# Patient Record
Sex: Female | Born: 1995 | Race: White | Hispanic: No | Marital: Single | Smoking: Never smoker
Health system: Southern US, Community
[De-identification: ages and names within clinical notes are randomized; demographics above are authoritative.]

## PROBLEM LIST (undated history)

## (undated) DIAGNOSIS — F319 Bipolar disorder, unspecified: Secondary | ICD-10-CM

## (undated) DIAGNOSIS — J45909 Unspecified asthma, uncomplicated: Secondary | ICD-10-CM

## (undated) DIAGNOSIS — E063 Autoimmune thyroiditis: Secondary | ICD-10-CM

## (undated) HISTORY — PX: WISDOM TOOTH EXTRACTION: SHX21

---

## 2015-07-24 ENCOUNTER — Encounter (HOSPITAL_COMMUNITY): Payer: Self-pay | Admitting: Emergency Medicine

## 2015-07-24 ENCOUNTER — Emergency Department (HOSPITAL_COMMUNITY)
Admission: EM | Admit: 2015-07-24 | Discharge: 2015-07-26 | Disposition: A | Discharge status: Other Acute Inpatient Hospital | Payer: MEDICAID | Attending: Emergency Medicine | Admitting: Emergency Medicine

## 2015-07-24 DIAGNOSIS — F919 Conduct disorder, unspecified: Secondary | ICD-10-CM | POA: Diagnosis not present

## 2015-07-24 DIAGNOSIS — R45851 Suicidal ideations: Secondary | ICD-10-CM

## 2015-07-24 DIAGNOSIS — Z8639 Personal history of other endocrine, nutritional and metabolic disease: Secondary | ICD-10-CM | POA: Diagnosis not present

## 2015-07-24 DIAGNOSIS — Z046 Encounter for general psychiatric examination, requested by authority: Secondary | ICD-10-CM | POA: Diagnosis present

## 2015-07-24 DIAGNOSIS — F332 Major depressive disorder, recurrent severe without psychotic features: Secondary | ICD-10-CM | POA: Diagnosis present

## 2015-07-24 DIAGNOSIS — J45909 Unspecified asthma, uncomplicated: Secondary | ICD-10-CM | POA: Insufficient documentation

## 2015-07-24 DIAGNOSIS — F329 Major depressive disorder, single episode, unspecified: Secondary | ICD-10-CM | POA: Insufficient documentation

## 2015-07-24 DIAGNOSIS — Z3202 Encounter for pregnancy test, result negative: Secondary | ICD-10-CM | POA: Diagnosis not present

## 2015-07-24 HISTORY — DX: Unspecified asthma, uncomplicated: J45.909

## 2015-07-24 HISTORY — DX: Autoimmune thyroiditis: E06.3

## 2015-07-24 LAB — CBC
HCT: 39.7 % (ref 36.0–46.0)
Hemoglobin: 14 g/dL (ref 12.0–15.0)
MCH: 31.2 pg (ref 26.0–34.0)
MCHC: 35.3 g/dL (ref 30.0–36.0)
MCV: 88.4 fL (ref 78.0–100.0)
PLATELETS: 279 10*3/uL (ref 150–400)
RBC: 4.49 MIL/uL (ref 3.87–5.11)
RDW: 12.5 % (ref 11.5–15.5)
WBC: 8.1 10*3/uL (ref 4.0–10.5)

## 2015-07-24 LAB — COMPREHENSIVE METABOLIC PANEL
ALT: 16 U/L (ref 14–54)
AST: 19 U/L (ref 15–41)
Albumin: 4.6 g/dL (ref 3.5–5.0)
Alkaline Phosphatase: 89 U/L (ref 38–126)
Anion gap: 9 (ref 5–15)
BILIRUBIN TOTAL: 0.5 mg/dL (ref 0.3–1.2)
BUN: 16 mg/dL (ref 6–20)
CALCIUM: 9.5 mg/dL (ref 8.9–10.3)
CHLORIDE: 107 mmol/L (ref 101–111)
CO2: 23 mmol/L (ref 22–32)
Creatinine, Ser: 0.9 mg/dL (ref 0.44–1.00)
Glucose, Bld: 127 mg/dL — ABNORMAL HIGH (ref 65–99)
Potassium: 3.9 mmol/L (ref 3.5–5.1)
Sodium: 139 mmol/L (ref 135–145)
TOTAL PROTEIN: 7.3 g/dL (ref 6.5–8.1)

## 2015-07-24 LAB — RAPID URINE DRUG SCREEN, HOSP PERFORMED
AMPHETAMINES: NOT DETECTED
Barbiturates: NOT DETECTED
Benzodiazepines: NOT DETECTED
Cocaine: NOT DETECTED
OPIATES: NOT DETECTED
Tetrahydrocannabinol: NOT DETECTED

## 2015-07-24 LAB — POC URINE PREG, ED: PREG TEST UR: NEGATIVE

## 2015-07-24 LAB — ACETAMINOPHEN LEVEL: Acetaminophen (Tylenol), Serum: <10 ug/mL — ABNORMAL LOW (ref 10–30)

## 2015-07-24 LAB — SALICYLATE LEVEL

## 2015-07-24 LAB — ETHANOL

## 2015-07-24 NOTE — ED Provider Notes (Signed)
CSN: [id number]     Arrival date & time 07/24/15  2232 History   First MD Initiated Contact with Patient 07/24/15 2309     Chief Complaint  Patient presents with  . Suicidal  . Medical Clearance     (Consider location/radiation/quality/duration/timing/severity/associated sxs/prior Treatment) HPI Comments: Patient is a 19 year old female with a history of Hashimoto's thyroiditis and asthma. She presents to the emergency department today for psychiatric evaluation. She reports that she has been having worsening depression and suicidal ideations. She states that the holidays are difficult time for her because this is the first year that she does not have any family. She states that she became close with her biological mother, after leaving foster care, around Thanksgiving of last year, but her mother passed away a few months later. She states that she has never known her biological father because he committed suicide when she was a few months old. She reports cutting her L forearm with a razor tonight secondary to SI. She has a hx of suicide attempt at 17 by drinking a bottle of hairspray and then pulling a large TV down on top of her head. She denies HI, etoh use, or illicit drug use.   The history is provided by the patient. No language interpreter was used.    Past Medical History  Diagnosis Date  . Hashimoto's thyroiditis   . Asthma    History reviewed. No pertinent past surgical history. History reviewed. No pertinent family history. Social History  Substance Use Topics  . Smoking status: None  . Smokeless tobacco: None  . Alcohol Use: None   OB History    No data available      Review of Systems  Psychiatric/Behavioral: Positive for suicidal ideas and behavioral problems.  All other systems reviewed and are negative.   Allergies  Cogentin and Lamictal  Home Medications   Prior to Admission medications   Medication Sig Start Date End Date Taking? Authorizing Provider   phenylephrine (SUDAFED PE) 10 MG TABS tablet Take 20 mg by mouth every 6 (six) hours as needed (sinus congestion).   Yes Historical Provider, MD  pseudoephedrine-acetaminophen (TYLENOL SINUS) 30-500 MG TABS tablet Take 1 tablet by mouth every 4 (four) hours as needed (cold/flu symptoms).   Yes Historical Provider, MD  traZODone (DESYREL) 150 MG tablet Take 75-150 mg by mouth at bedtime as needed for sleep.  05/17/15  Yes Historical Provider, MD   BP 150/96 mmHg  Pulse 89  Temp(Src) 98.1 F (36.7 C) (Oral)  Resp 19  Ht  (1.575 m)  Wt 88.95 kg  BMI 35.86 kg/m2  SpO2 97%   Physical Exam  Constitutional: She is oriented to person, place, and time. She appears well-developed and well-nourished. No distress.  Nontoxic/nonseptic appearing  HENT:  Head: Normocephalic and atraumatic.  Right Ear: Tympanic membrane, external ear and ear canal normal. Tympanic membrane is not perforated, not erythematous, not retracted and not bulging.  Left Ear: Tympanic membrane, external ear and ear canal normal. Tympanic membrane is not perforated, not erythematous, not retracted and not bulging.  Mouth/Throat: Uvula is midline, oropharynx is clear and moist and mucous membranes are normal.  Eyes: Conjunctivae and EOM are normal. No scleral icterus.  Neck: Normal range of motion.  Pulmonary/Chest: Effort normal. No respiratory distress.  Musculoskeletal: Normal range of motion.  Neurological: She is alert and oriented to person, place, and time. She exhibits normal muscle tone. Coordination normal.  Skin: Skin is warm and dry. No rash  noted. She is not diaphoretic. No erythema. No pallor.  Psychiatric: Her speech is normal and behavior is normal. She is not actively hallucinating. She exhibits a depressed mood. She expresses suicidal ideation. She expresses no homicidal ideation. She expresses suicidal plans. She expresses no homicidal plans.  Nursing note and vitals reviewed.   ED Course  Procedures  (including critical care time) Labs Review Labs Reviewed  COMPREHENSIVE METABOLIC PANEL - Abnormal; Notable for the following:    Glucose, Bld 127 (*)    All other components within normal limits  ACETAMINOPHEN LEVEL - Abnormal; Notable for the following:    Acetaminophen (Tylenol), Serum <10 (*)    All other components within normal limits  ETHANOL  SALICYLATE LEVEL  CBC  URINE RAPID DRUG SCREEN, HOSP PERFORMED  POC URINE PREG, ED    Imaging Review No results found. I have personally reviewed and evaluated these images and lab results as part of my medical decision-making.   EKG Interpretation None      MDM   Final diagnoses:  Depression with suicidal ideation    19 year old female presents to the emergency department for depression with suicidal ideations. She has a history of suicide attempt at the age of 19. Labs reviewed and patient medically cleared. Patient meets criteria for inpatient psychiatric treatment. She is stable for transfer to behavioral health once a bed is available. Accepting physician, Dr. Jama Flavorsobos. Disposition to be set by oncoming ED provider.   Filed Vitals:   07/24/15 2236  BP: 150/96  Pulse: 89  Temp: 98.1 F (36.7 C)  TempSrc: Oral  Resp: 19  Height: 5\' 2"  (1.575 m)  Weight: 88.95 kg  SpO2: 97%     Antony MaduraKelly Thunder [place], PA-C 07/25/15 0542  Gilda Creasehristopher J Pollina, MD 07/25/15 [phone]

## 2015-07-24 NOTE — ED Notes (Addendum)
Pt called [employer] saying she had cut herself in a suicide attempt. On their arrival, [facility] found patient with a very minor superficial 0.5 in abrasion to left inner wrist. Pt here voluntarily with thoughts of suicide. States they were triggered by it being time for the holidays without her mother, who died earlier this year. Hx of father killing himself when she was younger. "I tell everyone I'm sad but no one really seems to understand or listen." Says she no longer has any plan for suicide, does not want to cut herself, just wants to stop the thoughts before they progress. Hx of a suicide attempt at 19 y.o. By "drinking a bottle of hairspray, then pulling a large T.V. down on top of my head."  Only complaining of bilateral ear pain from "problems I always have with my ears."

## 2015-07-25 DIAGNOSIS — F332 Major depressive disorder, recurrent severe without psychotic features: Secondary | ICD-10-CM | POA: Diagnosis not present

## 2015-07-25 DIAGNOSIS — R45851 Suicidal ideations: Secondary | ICD-10-CM

## 2015-07-25 DIAGNOSIS — F329 Major depressive disorder, single episode, unspecified: Secondary | ICD-10-CM | POA: Insufficient documentation

## 2015-07-25 LAB — TSH: TSH: 1.534 u[IU]/mL (ref 0.350–4.500)

## 2015-07-25 MED ORDER — CITALOPRAM HYDROBROMIDE 20 MG PO TABS
20.0000 mg | ORAL_TABLET | Freq: Every day | ORAL | Status: DC
  Administered 2015-07-25: 20 mg via ORAL
  Filled 2015-07-25: qty 1

## 2015-07-25 MED ORDER — DIPHENHYDRAMINE HCL 25 MG PO CAPS
50.0000 mg | ORAL_CAPSULE | Freq: Once | ORAL | Status: AC
  Administered 2015-07-25: 50 mg via ORAL
  Filled 2015-07-25: qty 2

## 2015-07-25 MED ORDER — TRAZODONE HCL 50 MG PO TABS
50.0000 mg | ORAL_TABLET | Freq: Every day | ORAL | Status: DC
  Administered 2015-07-25: 50 mg via ORAL
  Filled 2015-07-25: qty 1

## 2015-07-25 MED ORDER — HYDROXYZINE HCL 25 MG PO TABS: 25.0000 mg | ORAL_TABLET | Freq: Three times a day (TID) | ORAL | Status: DC | PRN

## 2015-07-25 NOTE — BH Assessment (Signed)
Spoke to Johnson ControlsJoann Glover, [facility], who said bed 402-1 is available and Pt has been accepted by Alberteen SamFran Hobson, NP to the service of Dr. Carmon GinsbergF. Jama Flavorsobos. Notified ED staff.  Harlin RainFord Ellis Patsy BaltimoreWarrick Jr, LPC, [facility], [facility] Triage Specialist [phone]
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
## 2015-07-25 NOTE — ED Notes (Signed)
Patient up eating breakfast.  States she is "depressed."  She denies any SI.  She showed me the cut on her left wrist.  [facility]

## 2015-07-25 NOTE — ED Notes (Signed)
Patient appears restless, anxious; cooperative. Denies SI, HI, AVH at present. Rates anxiety 5/10, feelings of depression 8/10. Patient scratching at arms, creating red areas, reports sensitive skin. States she has ongoing pressure in her ears, 2/10. Feels that she has been sleeping more than usual in the past few weeks. Also reports decreased appetite. States she is not currently receiving any tx for depression. Reports recent move and no therapy since October.  Encouragement offered. Oriented to the unit.  Q 15 safety checks in place.

## 2015-07-25 NOTE — BH Assessment (Signed)
Pending review for possible placement with [facility].  
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
## 2015-07-26 ENCOUNTER — Encounter (HOSPITAL_COMMUNITY): Payer: Self-pay | Admitting: *Deleted

## 2015-07-26 ENCOUNTER — Inpatient Hospital Stay (HOSPITAL_COMMUNITY)
Admission: AD | Admit: 2015-07-26 | Discharge: 2015-08-03 | DRG: 885 | Disposition: A | Discharge status: Home / Self Care | Payer: Medicaid Other | Source: Intra-hospital | Attending: Psychiatry | Admitting: Psychiatry

## 2015-07-26 DIAGNOSIS — E063 Autoimmune thyroiditis: Secondary | ICD-10-CM | POA: Diagnosis present

## 2015-07-26 DIAGNOSIS — R45851 Suicidal ideations: Secondary | ICD-10-CM | POA: Diagnosis present

## 2015-07-26 DIAGNOSIS — G4700 Insomnia, unspecified: Secondary | ICD-10-CM | POA: Diagnosis present

## 2015-07-26 DIAGNOSIS — F419 Anxiety disorder, unspecified: Secondary | ICD-10-CM | POA: Diagnosis present

## 2015-07-26 DIAGNOSIS — Z23 Encounter for immunization: Secondary | ICD-10-CM

## 2015-07-26 DIAGNOSIS — Z62810 Personal history of physical and sexual abuse in childhood: Secondary | ICD-10-CM | POA: Diagnosis present

## 2015-07-26 DIAGNOSIS — G4710 Hypersomnia, unspecified: Secondary | ICD-10-CM | POA: Diagnosis present

## 2015-07-26 DIAGNOSIS — F332 Major depressive disorder, recurrent severe without psychotic features: Secondary | ICD-10-CM | POA: Diagnosis present

## 2015-07-26 DIAGNOSIS — Z590 Homelessness: Secondary | ICD-10-CM

## 2015-07-26 DIAGNOSIS — F4310 Post-traumatic stress disorder, unspecified: Secondary | ICD-10-CM | POA: Diagnosis present

## 2015-07-26 DIAGNOSIS — Z825 Family history of asthma and other chronic lower respiratory diseases: Secondary | ICD-10-CM | POA: Diagnosis not present

## 2015-07-26 DIAGNOSIS — F410 Panic disorder [episodic paroxysmal anxiety] without agoraphobia: Secondary | ICD-10-CM | POA: Diagnosis present

## 2015-07-26 LAB — T3: T3 TOTAL: 124 ng/dL (ref 71–180)

## 2015-07-26 LAB — T4: T4 TOTAL: 6.3 ug/dL (ref 4.5–12.0)

## 2015-07-26 MED ORDER — CITALOPRAM HYDROBROMIDE 10 MG PO TABS
10.0000 mg | ORAL_TABLET | Freq: Every day | ORAL | Status: DC
  Administered 2015-07-26 – 2015-07-29 (×4): 10 mg via ORAL
  Filled 2015-07-26 (×6): qty 1

## 2015-07-26 MED ORDER — ACETAMINOPHEN 325 MG PO TABS
650.0000 mg | ORAL_TABLET | Freq: Four times a day (QID) | ORAL | Status: DC | PRN
  Administered 2015-08-02: 650 mg via ORAL
  Filled 2015-07-26: qty 2

## 2015-07-26 MED ORDER — MAGNESIUM HYDROXIDE 400 MG/5ML PO SUSP: 30.0000 mL | Freq: Every day | ORAL | Status: DC | PRN

## 2015-07-26 MED ORDER — ALUM & MAG HYDROXIDE-SIMETH 200-200-20 MG/5ML PO SUSP
30.0000 mL | ORAL | Status: DC | PRN
  Administered 2015-07-28 – 2015-08-02 (×2): 30 mL via ORAL
  Filled 2015-07-26 (×2): qty 30

## 2015-07-26 MED ORDER — TRAZODONE HCL 150 MG PO TABS: 75.0000 mg | ORAL_TABLET | Freq: Every evening | ORAL | Status: DC | PRN

## 2015-07-26 MED ORDER — PNEUMOCOCCAL VAC POLYVALENT 25 MCG/0.5ML IJ INJ
0.5000 mL | INJECTION | INTRAMUSCULAR | Status: AC
  Administered 2015-07-27: 0.5 mL via INTRAMUSCULAR

## 2015-07-26 MED ORDER — HYDROXYZINE HCL 25 MG PO TABS: 25.0000 mg | ORAL_TABLET | Freq: Three times a day (TID) | ORAL | Status: DC | PRN

## 2015-07-26 MED ORDER — DIPHENHYDRAMINE HCL 25 MG PO CAPS
50.0000 mg | ORAL_CAPSULE | Freq: Four times a day (QID) | ORAL | Status: DC | PRN
  Administered 2015-07-26 – 2015-07-27 (×2): 50 mg via ORAL
  Filled 2015-07-26 (×2): qty 2

## 2015-07-26 MED ORDER — INFLUENZA VAC SPLIT QUAD 0.5 ML IM SUSY
0.5000 mL | PREFILLED_SYRINGE | INTRAMUSCULAR | Status: AC
  Administered 2015-07-27: 0.5 mL via INTRAMUSCULAR
  Filled 2015-07-26: qty 0.5

## 2015-07-26 MED ORDER — TRAZODONE HCL 100 MG PO TABS
100.0000 mg | ORAL_TABLET | Freq: Every evening | ORAL | Status: DC | PRN
  Administered 2015-07-26 – 2015-07-27 (×2): 100 mg via ORAL
  Filled 2015-07-26 (×2): qty 1

## 2015-07-26 NOTE — Tx Team (Signed)
Initial Interdisciplinary Treatment Plan   PATIENT STRESSORS: Loss of Mother Marital or family conflict Traumatic event   PATIENT STRENGTHS: Ability for insight Average or above average intelligence Motivation for treatment/growth   PROBLEM LIST: Problem List/Patient Goals Date to be addressed Date deferred Reason deferred Estimated date of resolution  Suicidal Ideation 07/26/15     Depression 07/26/15     "develop coping skills"                                           DISCHARGE CRITERIA:  Improved stabilization in mood, thinking, and/or behavior Motivation to continue treatment in a less acute level of care Need for constant or close observation no longer present Verbal commitment to aftercare and medication compliance  PRELIMINARY DISCHARGE PLAN: Attend aftercare/continuing care group Outpatient therapy  PATIENT/FAMIILY INVOLVEMENT: This treatment plan has been presented to and reviewed with the patient, Carolyn Gibbs.  The patient and family have been given the opportunity to ask questions and make suggestions.  Juliann ParesBowman, Frederick Klinger Elizabeth 07/26/2015, 1:41 AM

## 2015-07-26 NOTE — Progress Notes (Signed)
Adult Psychoeducational Group Note  Date:  07/26/2015 Time:  8:51 PM  Group Topic/Focus:  Wrap-Up Group:   The focus of this group is to help patients review their daily goal of treatment and discuss progress on daily workbooks.  Participation Level:  Active  Participation Quality:  Appropriate and Attentive  Affect:  Appropriate  Cognitive:  Appropriate  Insight: Appropriate and Good  Engagement in Group:  Engaged  Modes of Intervention:  Education  Additional Comments:  Pt had a good day and pt was happy she made friends. Pt goal for tomorrow is to work on Pharmacologistcoping skills. This Clinical research associatewriter provided patient with a list of coping skills.   Merlinda FrederickKeshia S Colleen Donahoe 07/26/2015, 8:51 PM
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
## 2015-07-26 NOTE — [facility] Group Notes (Signed)
[facility]  07/26/2015 8:45 AM  Participation Quality: Alert, Appropriate and Oriented  Mood/Affect: Lethargic  Depression Rating: 3  Anxiety Rating: 3  Thoughts of Suicide: Pt denies SI/HI  Will you contract for safety? Yes  Current AVH: Pt denies  Plan for Discharge/Comments: Pt attended discharge planning group and actively participated in group. CSW discussed suicide prevention education with the group and encouraged them to discuss discharge planning and any relevant barriers. Pt reports that she is feeling tired due to the late admission. Pt reports that she is new to this area and has no outpatient providers.  Transportation Means: Pt reports access to transportation  Supports: No supports mentioned at this time  Chad CordialLauren Carter, Theresia MajorsLCSWA 07/26/2015 9:39 AM

## 2015-07-26 NOTE — [facility] Suicide Risk Assessment (Signed)
[facility]   Nursing information obtained from:   patient and chart  Demographic factors:   19 year old single female, currently unemployed, living with BF Current Mental Status:   see below  Loss Factors:   death of mother earlier this year, [place] Historical Factors:   depression Risk Reduction Factors:   resilience , social support Total Time spent with patient: 45 minutes Principal Problem: Major depressive disorder, recurrent, severe without psychotic features ([facility]) Diagnosis:   Patient Active Problem List   Diagnosis Date Noted  . Major depressive disorder, recurrent, severe without psychotic features ([facility]) [F33.2] 07/26/2015  . Severe recurrent major depression without psychotic features ([facility]) [F33.2] 07/25/2015  . Depression with suicidal ideation [F32.9, R45.851]      Continued Clinical Symptoms:  Alcohol Use Disorder Identification Test Final Score (AUDIT): 0 The "Alcohol Use Disorders Identification Test", Guidelines for Use in Primary Care, Second Edition.  World [employer] [facility](WHO). Score between 0-7:  no or low risk or alcohol related problems. Score between 8-15:  moderate risk of alcohol related problems. Score between 16-19:  high risk of alcohol related problems. Score 20 or above:  warrants further diagnostic evaluation for alcohol dependence and treatment.   CLINICAL FACTORS:   19 year old female, who reports worsening depression with neuro-vegetative symptoms , recent episode of self cutting wrist ( superficial cut - no sutures needed ) . Mother passed away earlier this year, and patient states that recent events ( her birthday in particular) and upcoming holiday season has increased depression , sense of loss, grief.    Psychiatric Specialty Exam: Physical Exam  ROS  Blood pressure 116/65, pulse 113, temperature 98.7 F (37.1 C), temperature source Oral, resp. rate 18, height 5'  2.5" (1.588 m), weight 193 lb (87.544 kg).Body mass index is 34.72 kg/(m^2).   see admit note MSE   COGNITIVE FEATURES THAT CONTRIBUTE TO RISK:  Closed-mindedness and Loss of executive function    SUICIDE RISK:   Moderate:  Frequent suicidal ideation with limited intensity, and duration, some specificity in terms of plans, no associated intent, good self-control, limited dysphoria/symptomatology, some risk factors present, and identifiable protective factors, including available and accessible social support.  PLAN OF CARE: Patient will be admitted to inpatient psychiatric unit for stabilization and safety. Will provide and encourage milieu participation. Provide medication management and maked adjustments as needed.  Will follow daily.    Medical Decision Making:  Review of Psycho-Social Stressors (1), Review or order clinical lab tests (1), Established Problem, Worsening (2) and Review of New Medication or Change in Dosage (2)  I certify that inpatient services furnished can reasonably be expected to improve the patient's condition.   COBOS, FERNANDO 07/26/2015, 2:22 PM
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
## 2015-07-26 NOTE — Progress Notes (Signed)
DAR Note: Carolyn Gibbs has been visible on the unit.  She has attended groups.  She denies any SI/HI or A/V hallucinations at this time.  She continues to report that she is feeling tired and having some stomach pain and feels that it is possibly anxiety and she reported that she ate a lot at breakfast.  Urged her to eat lightly and see if that helps.  She declined medication for anxiety at this time as she wants to wait and talk with the doctor.  She completed her self inventory and reports that her depression is 1/10, hopelessness 0/10 and her anxiety is 2/10.  She states that her goal for today is to "deal with her stress" and she will accomplish this goal by "talking with people."  Encouraged participation in group and unit activities.  We will continue to monitor the progress towards her goals.

## 2015-07-27 NOTE — Tx Team (Addendum)
Interdisciplinary Treatment Plan Update (Adult) Date: 07/27/2015    Time Reviewed: 9:30 AM  Progress in Treatment: Attending groups: Continuing to assess, patient new to milieu Participating in groups: Continuing to assess, patient new to milieu Taking medication as prescribed: Yes Tolerating medication: Yes Family/Significant other contact made: No, CSW assessing for appropriate contacts Patient understands diagnosis: Yes Discussing patient identified problems/goals with staff: Yes Medical problems stabilized or resolved: Yes Denies suicidal/homicidal ideation: Yes Issues/concerns per patient self-inventory: Yes Other:  New problem(s) identified: N/A  Discharge Plan or Barriers: CSW continuing to assess, patient new to milieu. Patient plans to return home at discharge to follow up with outpatient services.  Reason for Continuation of Hospitalization:  Depression Anxiety Medication Stabilization   Comments: N/A  Estimated length of stay: 2-3 days   Patient is a 19 year old Caucasian female admitted for depression and SI. Patient will benefit from crisis stabilization, medication evaluation, group therapy, and psycho education in addition to case management for discharge planning. Patient and CSW reviewed pt's identified goals and treatment plan. Pt verbalized understanding and agreed to treatment plan.     Review of initial/current patient goals per problem list:  1. Goal(s): Patient will participate in aftercare plan   Met: Yes   Target date: 3-5 days post admission date   As evidenced by: Patient will participate within aftercare plan AEB aftercare provider and housing plan at discharge being identified.  12/13: Goal met: Patient plans to return home with outpatient services   2. Goal (s): Patient will exhibit decreased depressive symptoms and suicidal ideations.   Met: Yes   Target date: 3-5 days post admission date   As evidenced by: Patient will  utilize self rating of depression at 3 or below and demonstrate decreased signs of depression or be deemed stable for discharge by MD.  12/13: Goal Progressing. Patient reports improvement in depressive symptoms, denies SI.  12/14: Goal met: Patient rates depression at 2-3 today.    3. Goal(s): Patient will demonstrate decreased signs and symptoms of anxiety.   Met: Yes   Target date: 3-5 days post admission date   As evidenced by: Patient will utilize self rating of anxiety at 3 or below and demonstrated decreased signs of anxiety, or be deemed stable for discharge by MD  12/13; Goal Progressing. Patient reports an improvement in anxiety symptoms.  12/14: Goal met. Patient rates anxiety at 1 today.     Attendees: Patient:  07/27/2015 10:48 AM   Family:  07/27/2015 10:48 AM   Physician: Dr. Sabra Heck, Dr. Parke Poisson  07/27/2015 10:48 AM   Nursing: Loletta Specter, Grayland Ormond, Marcella Dubs, RN 07/27/2015 10:48 AM   Clinical Social Worker: Maxie Better, LCSW 07/27/2015 10:48 AM   Clinical Social Worker: Peri Maris, Erasmo Downer Madylyn Insco LCSWA 07/27/2015 10:48 AM   Other: Gerline Legacy Nurse Case Manager 07/27/2015 10:48 AM   Other:  07/27/2015 10:48 AM   Other: Gillie Manners, NP 07/27/2015 10:48 AM   Other:  07/27/2015 10:48 AM          Scribe for Treatment Team:  Tilden Fossa, MSW, [employer] [phone]

## 2015-07-27 NOTE — [facility] Group Notes (Signed)
[facility] LCSW Group Therapy 07/27/2015 1:15 PM Type of Therapy: Group Therapy Participation Level: Active  Participation Quality: Attentive, Sharing and Supportive  Affect: Appropriate  Cognitive: Alert and Oriented  Insight: Developing/Improving and Engaged  Engagement in Therapy: Developing/Improving and Engaged  Modes of Intervention: Activity, Clarification, Confrontation, Discussion, Education, Exploration, Limit-setting, Orientation, Problem-solving, Rapport Building, Dance movement psychotherapisteality Testing, Socialization and Support  Summary of Progress/Problems: Patient was attentive and engaged with speaker from [employer]. Patient was attentive to speaker while they shared their story of dealing with mental health and overcoming it. Patient expressed interest in their programs and services and received information on their agency. Patient processed ways they can relate to the speaker.   Samuella BruinKristin Jazzmin Newbold, MSW, [employer]CSWA Clinical Social Worker [facility] [phone]

## 2015-07-27 NOTE — [facility] Group Notes (Signed)
[facility] LCSW Group Therapy 07/26/15 1:15 pm  Type of Therapy: Group Therapy Participation Level: Active  Participation Quality: Attentive, Sharing and Supportive  Affect: Depressed and Flat  Cognitive: Alert and Oriented  Insight: Developing/Improving and Engaged  Engagement in Therapy: Developing/Improving and Engaged  Modes of Intervention: Clarification, Confrontation, Discussion, Education, Exploration,  Limit-setting, Orientation, Problem-solving, Rapport Building, Dance movement psychotherapisteality Testing, Socialization and Support  Summary of Progress/Problems: Pt identified obstacles faced currently and processed barriers involved in overcoming these obstacles. Pt identified steps necessary for overcoming these obstacles and explored motivation (internal and external) for facing these difficulties head on. Pt further identified one area of concern in their lives and chose a goal to focus on for today. Patient identified her unresolved grief over her mother's recent death as an obstacle. She identified having mixed emotions about her mother's passing. CSW and other group members provided patient with emotional support and encouragement.  Samuella BruinKristin Shantanu Strauch, MSW, [employer]CSWA Clinical Social Worker [facility] [phone]
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
## 2015-07-27 NOTE — Progress Notes (Signed)
[facility] MD Progress Note  07/27/2015 3:55 PM Carolyn Gibbs  MRN:  [id number] Subjective:   Patient reports partial improvement, states she is less severely depressed, but still feels sad and states " even though I slept well last night, I still feel tired and with little energy". Denies medication side effects. Objective : I have discussed case with treatment team and have met with patient. Patient going to some groups, visible in milieu, but little interaction with peers.  No disruptive or agitated behaviors.  As above, reports ongoing depression, but does endorse some partial improvement compared to prior to admission. At this time denies suicidal ideations and states she has not had any thoughts of cutting  On self today. No medication side effects. Principal Problem: Major depressive disorder, recurrent, severe without psychotic features ([place]) Diagnosis:   Patient Active Problem List   Diagnosis Date Noted  . Major depressive disorder, recurrent, severe without psychotic features ([place]) [F33.2] 07/26/2015  . Severe recurrent major depression without psychotic features ([place]) [F33.2] 07/25/2015  . Depression with suicidal ideation [F32.9, R45.851]    Total Time spent with patient: 20 minutes   Past Medical History:  Past Medical History  Diagnosis Date  . Hashimoto's thyroiditis   . Asthma    History reviewed. No pertinent past surgical history. Family History: History reviewed. No pertinent family history.  Social History:  History  Alcohol Use No     History  Drug Use No    Social History   Social History  . Marital Status: Single    Spouse Name: N/A  . Number of Children: N/A  . Years of Education: N/A   Social History Main Topics  . Smoking status: Never Smoker   . Smokeless tobacco: None  . Alcohol Use: No  . Drug Use: No  . Sexual Activity: Yes   Other Topics Concern  . None   Social History Narrative   Additional Social History:    History of alcohol /  drug use?: No history of alcohol / drug abuse  Sleep: Good  Appetite:  Good  Current Medications: Current Facility-Administered Medications  Medication Dose Route Frequency Provider Last Rate Last Dose  . acetaminophen (TYLENOL) tablet 650 mg  650 mg Oral Q6H PRN Harriet Butte, NP      . alum & mag hydroxide-simeth (MAALOX/MYLANTA) 200-200-20 MG/5ML suspension 30 mL  30 mL Oral Q4H PRN Harriet Butte, NP      . citalopram (CELEXA) tablet 10 mg  10 mg Oral Daily Jenne Campus, MD   10 mg at 07/27/15 7619  . diphenhydrAMINE (BENADRYL) capsule 50 mg  50 mg Oral Q6H PRN Laverle Hobby, PA-C   50 mg at 07/26/15 2142  . magnesium hydroxide (MILK OF MAGNESIA) suspension 30 mL  30 mL Oral Daily PRN Harriet Butte, NP      . traZODone (DESYREL) tablet 100 mg  100 mg Oral QHS PRN Harriet Butte, NP   100 mg at 07/26/15 2142    Lab Results: No results found for this or any previous visit (from the past 48 hour(s)).  Physical Findings: AIMS: Facial and Oral Movements Muscles of Facial Expression: None, normal Lips and Perioral Area: None, normal Jaw: None, normal Tongue: None, normal,Extremity Movements Upper (arms, wrists, hands, fingers): None, normal Lower (legs, knees, ankles, toes): None, normal, Trunk Movements Neck, shoulders, hips: None, normal, Overall Severity Severity of abnormal movements (highest score from questions above): None, normal Incapacitation due to abnormal movements: None,  normal Patient's awareness of abnormal movements (rate only patient's report): No Awareness, Dental Status Current problems with teeth and/or dentures?: No Does patient usually wear dentures?: No  CIWA:    COWS:     Musculoskeletal: Strength & Muscle Tone: within normal limits Gait & Station: normal Patient leans: N/A  Psychiatric Specialty Exam: ROS denies headache, denies chest pain, denies shortness of breath  Blood pressure 112/68, pulse 116, temperature 97.6 F (36.4 C),  temperature source Oral, resp. rate 16, height 5' 2.5" (1.588 m), weight 193 lb (87.544 kg).Body mass index is 34.72 kg/(m^2).  General Appearance: Fairly Groomed  Engineer, water::  Good  Speech:  Normal Rate  Volume:  Decreased  Mood:  Depressed, but better than on admission  Affect:  Constricted, but does smile appropriately at times   Thought Process:  Linear  Orientation:  Full (Time, Place, and Person)  Thought Content:  denies hallucinations, no delusions, not internally preoccupied  Suicidal Thoughts:  No- today denies suicidal ideations, and denies any thoughts of hurting self , contracts for safety on unit   Homicidal Thoughts:  No  Memory:  recent and remote grossly intact   Judgement:  Fair  Insight:  Fair  Psychomotor Activity:  Normal  Concentration:  Good  Recall:  Good  Fund of Knowledge:Good  Language: Good  Akathisia:  Negative  Handed:  Right  AIMS (if indicated):     Assets:  Desire for Improvement Physical Health Resilience  ADL's:  Intact  Cognition: WNL  Sleep:  Number of Hours: 6.75  Assessment - patient remains depressed but has improved compared to admission. At this time denying any active SI or any thoughts of cutting. Affect is still constricted. She is tolerating CELEXA trial well.  Treatment Plan Summary: Daily contact with patient to assess and evaluate symptoms and progress in treatment, Medication management, Plan inpatient treatment  and medications as below Continue to encourage milieu, group participation to work on coping skills and symptom reduction Continue CELEXA 10 mgrs QAM for depression Continue TRAZODONE 100 mgrs QHS PRN for insomnia as needed  Continue VISTARIL at 25 mgrs Q 6 hours PRN for anxiety, as needed  Carolyn Gibbs 07/27/2015, 3:55 PM

## 2015-07-27 NOTE — Progress Notes (Signed)
Pt attended spiritual care group on grief and loss facilitated by chaplain Naoko Diperna  Group opened with brief discussion and psycho-social ed around grief and loss in relationships and in relation to self - identifying life patterns, circumstances, changes that cause losses. Established group norm of speaking from own life experience. Group goal of establishing open and affirming space for members to share loss and experience with grief, normalize grief experience and provide psycho social education and grief support. Group process drew on brief CBT, narrative and Adlerian models    

## 2015-07-27 NOTE — Progress Notes (Signed)
Adult Psychoeducational Group Note  Date:  07/27/2015 Time:  10:32 PM  Group Topic/Focus:  Wrap-Up Group:   The focus of this group is to help patients review their daily goal of treatment and discuss progress on daily workbooks.  Participation Level:  Active  Participation Quality:  Appropriate and Attentive  Affect:  Appropriate  Cognitive:  Alert and Appropriate  Insight: Appropriate and Good  Engagement in Group:  Engaged  Modes of Intervention:  Education  Additional Comments:  Pt overall had a good day but was upset when roommate left. Pt goal for tomorrow is to continue to work on Pharmacologistcoping skills this Clinical research associatewriter provided for her.   Merlinda FrederickKeshia S Yulonda Wheeling 07/27/2015, 10:32 PM

## 2015-07-27 NOTE — Progress Notes (Signed)
Recreation Therapy Notes  Animal-Assisted Activity (AAA) Program Checklist/Progress Notes Patient Eligibility Criteria Checklist & Daily Group note for Rec Tx Intervention  Date: 12.13.2016  Time: 2:45pm Location: [address]    AAA/T Program Assumption of Risk Form signed by Patient/ or Parent Legal Guardian yes  Patient is free of allergies or sever asthma yes  Patient reports no fear of animals yes  Patient reports no history of cruelty to animals yes  Patient understands his/her participation is voluntary yes  Patient washes hands before animal contact yes  Patient washes hands after animal contact yes  Behavioral Response: Appropriate   Education: Hand Washing, Appropriate Animal Interaction   Education Outcome: Acknowledges education.   Clinical Observations/Feedback: Patient engaged appropriately in pet therapy session, interacting with therapy dog and peers appropriately during session.   [name], LRT/CTRS  Jearl KlinefelterBlanchfield, Carolyn Gibbs 07/27/2015 3:19 PM

## 2015-07-27 NOTE — Progress Notes (Signed)
Patient ID: Carolyn Gibbs, female   DOB: 29-Mar-1996, 19 y.o.   MRN: [id number] D: "I tired to ignore the thought". Pt reports her way of coping with mother's death. Pt reports this is the first Christmas and also heading towards the anniversary of her death has been very hard. Patient in room on approach. Pt reports itching after her shower. Benadryl given with relief.  Pt denies SI/HI/AVH and pain. Pt attended and participated in evening wrap up group. Cooperative with assessment.    A: Met with pt 1:1. Medications administered as prescribed. Support and encouragement provided to engage in milieu. Pt encouraged to discuss feelings and come to staff with any question or concerns.   R: Patient remains safe and complaint with medications.

## 2015-07-27 NOTE — Progress Notes (Signed)
Patient ID: [name], female   DOB: 08/22/1995, 19 y.o.   MRN: [id number]  Adult Psychoeducational Group Note  Date:  07/27/2015 Time: 09:10AM  Group Topic/Focus:  Recovery Goals:   The focus of this group is to identify appropriate goals for recovery and establish a plan to achieve them.  Participation Level:  Minimal  Participation Quality:  Appropriate  Affect:  Flat  Cognitive:  Appropriate   Insight: Appropriate  Engagement in Group:  Engaged  Modes of Intervention:  Activity, Discussion, Education and Support  Additional Comments:  Pt to identify one goal in their personal recovery today.   Aurora Maskwyman, Latangela Mccomas E 07/27/2015, 10:40 AM

## 2015-07-27 NOTE — Progress Notes (Signed)
Pt reports her day started out rough as her roommate that she was getting along well with was discharged this morning.  She said she was sad for herself, but happy that her roommate had gotten better and was able to go home.  She said then it made her realize that she would get better and be able to go home soon.  Pt denies SI/HI/AVH.  She says the medications are helping her.  She is pleasant and cooperative with staff and peers.  She makes her needs known to staff.  Pt plans to return home with her boyfriend at discharge.  Support and encouragement offered.  Safety maintained with q15 minute checks.

## 2015-07-27 NOTE — Progress Notes (Signed)
Patient ID: Carolyn Gibbs, female   DOB: 03/13/1996, 19 y.o.   MRN: [id number]   Pt currently presents with a flat affect and pleasant behavior. Per self inventory, pt rates depression at a 0, hopelessness 0 and anxiety 3. Pt's daily goal is to "coping skills" and they intend to do so by "ask for a list of coping skills to practice." Pt reports fair sleep, a good appetite, low energy and good concentration. Pt goes to groups today. Pt seen in milieu but doesn't interact with peers.   Pt provided with medications per providers orders. Pt's labs and vitals were monitored throughout the day. Pt supported emotionally and encouraged to express concerns and questions. Pt educated on medications.  Pt's safety ensured with 15 minute and environmental checks. Pt currently denies SI/HI and A/V hallucinations. Pt verbally agrees to seek staff if SI/HI or A/VH occurs and to consult with staff before acting on these thoughts. Will continue POC.

## 2015-07-28 MED ORDER — TRAZODONE HCL 50 MG PO TABS
50.0000 mg | ORAL_TABLET | Freq: Every evening | ORAL | Status: DC | PRN
  Administered 2015-07-28: 50 mg via ORAL

## 2015-07-28 MED ORDER — DIPHENHYDRAMINE HCL 25 MG PO CAPS
25.0000 mg | ORAL_CAPSULE | Freq: Two times a day (BID) | ORAL | Status: DC | PRN
Start: 2015-07-28 — End: 2015-08-03
  Administered 2015-07-29: 25 mg via ORAL
  Filled 2015-07-28: qty 1

## 2015-07-28 NOTE — Progress Notes (Signed)
Recreation Therapy Notes  Date: 12.14.2016  Time: 9:30am Location: [address] Group Room   Group Topic: Stress Management  Goal Area(s) Addresses:  Patient will actively participate in stress management techniques presented during session.   Behavioral Response: Did not attend.   [name] [employer] Domanski, LRT/CTRS        Galena Logie L 07/28/2015 2:19 PM 

## 2015-07-28 NOTE — [facility] Group Notes (Signed)
   [facility] Note  07/28/2015  8:45 AM   Participation Quality: Alert, Appropriate and Oriented  Mood/Affect: Lethargic  Depression Rating: 2-3  Anxiety Rating: 1  Thoughts of Suicide: Pt denies SI/HI  Will you contract for safety? Yes  Current AVH: Pt denies  Plan for Discharge/Comments: Pt attended discharge planning group and actively participated in group. CSW provided pt with today's workbook. Patient plans to return home with outpatient services.   Transportation Means: Pt reports access to transportation- needs bus pass  Supports: No supports mentioned at this time  Samuella BruinKristin Linken Mcglothen, MSW, [employer]CSWA Clinical Social Worker [facility] [phone]

## 2015-07-28 NOTE — [facility] Suicide Risk Assessment (Signed)
[facility] INPATIENT:  Family/Significant Other Suicide Prevention Education  Suicide Prevention Education:  Education Completed; boyfriend Carolyn Gibbs [phone],  (name of family member/significant other) has been identified by the patient as the family member/significant other with whom the patient will be residing, and identified as the person(s) who will aid the patient in the event of a mental health crisis (suicidal ideations/suicide attempt).  With written consent from the patient, the family member/significant other has been provided the following suicide prevention education, prior to the and/or following the discharge of the patient.  The suicide prevention education provided includes the following:  Suicide risk factors  Suicide prevention and interventions  National Suicide Hotline telephone number  [facility] assessment telephone number  [facility] Emergency Assistance 911  [facility] and/or Residential Mobile Crisis Unit telephone number  Request made of family/significant other to:  Remove weapons (e.g., guns, rifles, knives), all items previously/currently identified as safety concern.    Remove drugs/medications (over-the-counter, prescriptions, illicit drugs), all items previously/currently identified as a safety concern.  The family member/significant other verbalizes understanding of the suicide prevention education information provided.  The family member/significant other agrees to remove the items of safety concern listed above.  Carolyn Gibbs, Carolyn Gibbs 07/28/2015, 12:37 PM

## 2015-07-28 NOTE — Progress Notes (Addendum)
Patient ID: Carolyn Gibbs, female   DOB: 04-17-1996, 19 y.o.   MRN: [id number] [facility] MD Progress Note  07/28/2015 4:42 PM Zykiria Bruening  MRN:  [id number] Subjective:   Patient reports feeling better overall, less depressed. States she feels " tired this morning", and unsure whether it may be a side effect from medication.  Objective : I have discussed case with treatment team and have met with patient. Patient reports improvement compared to admission. She felt saddened about her roommate being discharged, as she had built some rapport with her, but states that in general she is feeling "OK". She does report some ongoing depression, and does not feel she is back to her normal self or state of mood yet . Visible in milieu, going to groups. No disruptive or agitated behaviors.  Denies any self cutting or any thoughts of hurting self at this time. At this time denies suicidal ideations and states she has not had any thoughts of cutting  On self today. Tolerating medications well . Principal Problem: Major depressive disorder, recurrent, severe without psychotic features ([place]) Diagnosis:   Patient Active Problem List   Diagnosis Date Noted  . Major depressive disorder, recurrent, severe without psychotic features ([address]) [F33.2] 07/26/2015  . Severe recurrent major depression without psychotic features ([place]) [F33.2] 07/25/2015  . Depression with suicidal ideation [F32.9, R45.851]    Total Time spent with patient: 20 minutes   Past Medical History:  Past Medical History  Diagnosis Date  . Hashimoto's thyroiditis   . Asthma    History reviewed. No pertinent past surgical history. Family History: History reviewed. No pertinent family history.  Social History:  History  Alcohol Use No     History  Drug Use No    Social History   Social History  . Marital Status: Single    Spouse Name: N/A  . Number of Children: N/A  . Years of Education: N/A   Social History Main Topics  .  Smoking status: Never Smoker   . Smokeless tobacco: None  . Alcohol Use: No  . Drug Use: No  . Sexual Activity: Yes   Other Topics Concern  . None   Social History Narrative   Additional Social History:    History of alcohol / drug use?: No history of alcohol / drug abuse  Sleep: Good  Appetite:  Good  Current Medications: Current Facility-Administered Medications  Medication Dose Route Frequency Provider Last Rate Last Dose  . acetaminophen (TYLENOL) tablet 650 mg  650 mg Oral Q6H PRN Harriet Butte, NP      . alum & mag hydroxide-simeth (MAALOX/MYLANTA) 200-200-20 MG/5ML suspension 30 mL  30 mL Oral Q4H PRN Harriet Butte, NP      . citalopram (CELEXA) tablet 10 mg  10 mg Oral Daily Jenne Campus, MD   10 mg at 07/28/15 0839  . diphenhydrAMINE (BENADRYL) capsule 50 mg  50 mg Oral Q6H PRN Laverle Hobby, PA-C   50 mg at 07/27/15 1807  . magnesium hydroxide (MILK OF MAGNESIA) suspension 30 mL  30 mL Oral Daily PRN Harriet Butte, NP      . traZODone (DESYREL) tablet 100 mg  100 mg Oral QHS PRN Harriet Butte, NP   100 mg at 07/27/15 2130    Lab Results: No results found for this or any previous visit (from the past 48 hour(s)).  Physical Findings: AIMS: Facial and Oral Movements Muscles of Facial Expression: None, normal Lips and Perioral Area: None,  normal Jaw: None, normal Tongue: None, normal,Extremity Movements Upper (arms, wrists, hands, fingers): None, normal Lower (legs, knees, ankles, toes): None, normal, Trunk Movements Neck, shoulders, hips: None, normal, Overall Severity Severity of abnormal movements (highest score from questions above): None, normal Incapacitation due to abnormal movements: None, normal Patient's awareness of abnormal movements (rate only patient's report): No Awareness, Dental Status Current problems with teeth and/or dentures?: No Does patient usually wear dentures?: No  CIWA:    COWS:     Musculoskeletal: Strength & Muscle  Tone: within normal limits Gait & Station: normal Patient leans: N/A  Psychiatric Specialty Exam: ROS denies headache, denies chest pain, denies shortness of breath  Blood pressure 105/47, pulse 104, temperature 98 F (36.7 C), temperature source Oral, resp. rate 14, height 5' 2.5" (1.588 m), weight 193 lb (87.544 kg).Body mass index is 34.72 kg/(m^2).  General Appearance: Fairly Groomed  Engineer, water::  Good  Speech:  Normal Rate  Volume:  Decreased  Mood:   Improving, states she is feeling less depressed   Affect:   Has improved but does remain somewhat  constricted in affect    Thought Process:  Linear  Orientation:  Full (Time, Place, and Person)  Thought Content:  denies hallucinations, no delusions, not internally preoccupied  Suicidal Thoughts:  No- today denies suicidal ideations, and denies any thoughts of hurting self , contracts for safety on unit   Homicidal Thoughts:  No  Memory:  recent and remote grossly intact   Judgement:  Fair  Insight:  Fair  Psychomotor Activity:  Normal  Concentration:  Good  Recall:  Good  Fund of Knowledge:Good  Language: Good  Akathisia:  Negative  Handed:  Right  AIMS (if indicated):     Assets:  Desire for Improvement Physical Health Resilience  ADL's:  Intact  Cognition: WNL  Sleep:  Number of Hours: 6.75  Assessment - patient improved compared to admission presentation. States she does continue to have some depression but acknowledges overall improvement . Denies medication side effects at this time, except for some subjective sedation.  No current self injurious ideations .   Treatment Plan Summary: Daily contact with patient to assess and evaluate symptoms and progress in treatment, Medication management, Plan inpatient treatment  and medications as below Continue to encourage milieu, group participation to work on coping skills and symptom reduction Continue CELEXA 10 mgrs QAM for depression Decrease  TRAZODONE to 50  mgrs QHS  PRN for insomnia as needed ( rationale for decreasing dose is to minimize risk of AM sedation)  Decrease  BENADRYL to 25  mgrs  BID  PRN for anxiety, as needed  COBOS, FERNANDO 07/28/2015, 4:42 PM

## 2015-07-28 NOTE — Progress Notes (Signed)
D- Patient stated that she was having a good day, she did not have any thoughts of harming herself or others.  Patient was noted to have an upsetting conversation with her boyfriend in which she got off of the phone tearful and refused to speak to Clinical research associatewriter.  Patient denies SI/HI and AVH.  Patient attended groups and engaged appropriately with peers.   A-  Assess patient for safety, offer medications as prescribed, engage patient in 1:1 therapeutic talks.   R-  Patient was able to contract for safety this shift.

## 2015-07-28 NOTE — Progress Notes (Signed)
Pt stated that she had a pretty rough day and she hopes that tomorrow will be better.

## 2015-07-28 NOTE — Clinical Social Work Note (Signed)
Referral faxed to [facility] for aftercare services.   Samuella BruinKristin Chord Takahashi, MSW, [employer]CSWA Clinical Social Worker [facility] [phone]

## 2015-07-29 MED ORDER — CITALOPRAM HYDROBROMIDE 20 MG PO TABS
20.0000 mg | ORAL_TABLET | Freq: Every day | ORAL | Status: DC
Start: 2015-07-30 — End: 2015-08-03
  Administered 2015-07-30 – 2015-08-03 (×5): 20 mg via ORAL
  Filled 2015-07-29 (×8): qty 1

## 2015-07-29 MED ORDER — TRAZODONE HCL 100 MG PO TABS
100.0000 mg | ORAL_TABLET | Freq: Every evening | ORAL | Status: DC | PRN
  Administered 2015-07-29: 100 mg via ORAL
  Filled 2015-07-29: qty 1

## 2015-07-29 NOTE — Plan of Care (Signed)
Problem: Diagnosis: Increased Risk For Suicide Attempt Goal: STG-Patient Will Comply With Medication Regime Outcome: Progressing Carolyn LimHolly is compliant with medication regime. Able to ask for PRN medications when needed.

## 2015-07-29 NOTE — [facility] Group Notes (Signed)
[facility] LCSW Group Therapy 07/28/15  1:15 PM Type of Therapy: Group Therapy Participation Level: Active  Participation Quality: Attentive, Sharing and Supportive  Affect: Depressed and lethargic  Cognitive: Alert and Oriented  Insight: Developing/Improving and Engaged  Engagement in Therapy: Developing/Improving and Engaged  Modes of Intervention: Clarification, Confrontation, Discussion, Education, Exploration, Limit-setting, Orientation, Problem-solving, Rapport Building, Dance movement psychotherapisteality Testing, Socialization and Support  Summary of Progress/Problems: The topic for group today was emotional regulation. This group focused on both positive and negative emotion identification and allowed group members to process ways to identify feelings, regulate negative emotions, and find healthy ways to manage internal/external emotions. Group members were asked to reflect on a time when their reaction to an emotion led to a negative outcome and explored how alternative responses using emotion regulation would have benefited them. Group members were also asked to discuss a time when emotion regulation was utilized when a negative emotion was experienced. Patient discussed difficulty regulating her grief and feeling isolated. CSW and other group members provided patient with emotional support and encouragement.  Samuella BruinKristin Hosea Han[place]alt, MSW, [employer]CSWA Clinical Social Worker [facility] [phone]

## 2015-07-29 NOTE — [facility] Group Notes (Signed)
[facility] Group Therapy 07/29/2015 1:15pm  Type of Therapy: Mental Health Association Presentation  Participation Level: Active  Participation Quality: Attentive  Affect: Appropriate  Cognitive: Oriented  Insight: Developing/Improving  Engagement in Therapy: Engaged  Modes of Intervention: Discussion, Education and Socialization  Summary of Progress/Problems: Mental Health Association (MHA) Speaker came to talk about his personal journey with substance abuse and addiction. The pt processed ways by which to relate to the speaker. MHA speaker provided handouts and educational information pertaining to groups and services offered by the [facility]. Pt was engaged in speaker's presentation and was receptive to resources provided.    Carolyn CordialLauren Gibbs, LCSWA 07/29/2015 1:27 PM

## 2015-07-29 NOTE — Progress Notes (Signed)
Patient ID: Carolyn KyleHolly Gibbs, female   DOB: 03/30/1996, 19 y.o.   MRN: [id number]  DAR: Pt. Denies HI and A/V Hallucinations. She does report passive SI but is able to contract for safety. She reports sleep is poor, appetite is good, energy level is low, and concentration is good. She rates her depression 6/10, hopelessness 8/10, and anxiety 3/10. Patient does not report any pain but does state that she had dried blood in her nose this morning from the dry air. She reports some allergies and received PRN Benadryl which was helpful. Support and encouragement provided to the patient. Scheduled medications administered to patient per physician's orders. Patient is receptive and cooperative. She is seen in the milieu at times. She does seem to take naps throughout the day. Q15 minute checks are maintained for safety.

## 2015-07-29 NOTE — Progress Notes (Signed)
Adult Psychoeducational Group Note  Date:  07/29/2015 Time:  0900  Group Topic/Focus:  Orientation:   The focus of this group is to educate the patient on the purpose and policies of crisis stabilization and provide a format to answer questions about their admission.  The group details unit policies and expectations of patients while admitted.  Participation Level:  Did Not Attend  Participation Quality:    Affect:    Cognitive:    Insight:   Engagement in Group:    Modes of Intervention:    Additional Comments:   Heyden Jaber L 07/29/2015, 11:08 AM

## 2015-07-29 NOTE — Progress Notes (Signed)
D. Pt had been up and visible in milieu this evening, did attend and participate in evening group activity. Pt seen interacting appropriately with peers in milieu. Pt spoke some of her day, spoke about feeling less depressed and spoke about how she has been sleeping ok since she's been here. Pt received evening medication without incident. A. Support and encouragement provided. R. Safety maintained, will continue to monitor.

## 2015-07-29 NOTE — Progress Notes (Signed)
Pt attended karaoke group this evening.  

## 2015-07-29 NOTE — Progress Notes (Signed)
Patient ID: Carolyn Gibbs, Carolyn Gibbs   DOB: [birth year], 19 y.o.   MRN: [id number] [facility] MD Progress Note  07/29/2015 8:21 AM Carolyn Gibbs  MRN:  [id number] Subjective:   Patient  States she feels more depressed today, which she attributes to  break up with her boyfriend ( states " yesterday he broke up with me over the phone") , and states she also had an upsetting phone conversation with aunt. States aunt told her " that I was unstable and that she was not going to help me out any more ".  Denies medication side effects.   Objective : I have discussed case with treatment team and have met with patient. As noted, patient reports that she is feeling more depressed and dejected today after break up with BF and non supportive phone contact with aunt. At this time ruminates about these stressors, losses. States she did not sleep well last night. Energy level low, appetite "OK".  Tolerating medications well . She reports feeling " tired" because she slept poorly last night . States that with the above stressors she has again been thinking of cutting, but states " I am not going to do it, I contracted with the Nurses ". Patient has been visible on unit, and has been going to groups. Principal Problem: Major depressive disorder, recurrent, severe without psychotic features ([place]) Diagnosis:   Patient Active Problem List   Diagnosis Date Noted  . Major depressive disorder, recurrent, severe without psychotic features ([place]) [F33.2] 07/26/2015  . Severe recurrent major depression without psychotic features ([place]) [F33.2] 07/25/2015  . Depression with suicidal ideation [F32.9, R45.851]    Total Time spent with patient:  25 minutes   Past Medical History:  Past Medical History  Diagnosis Date  . Hashimoto's thyroiditis   . Asthma    History reviewed. No pertinent past surgical history. Family History: History reviewed. No pertinent family history.  Social History:  History  Alcohol Use No      History  Drug Use No    Social History   Social History  . Marital Status: Single    Spouse Name: N/A  . Number of Children: N/A  . Years of Education: N/A   Social History Main Topics  . Smoking status: Never Smoker   . Smokeless tobacco: None  . Alcohol Use: No  . Drug Use: No  . Sexual Activity: Yes   Other Topics Concern  . None   Social History Narrative   Additional Social History:    History of alcohol / drug use?: No history of alcohol / drug abuse  Sleep:  Fair   Appetite:  Good  Current Medications: Current Facility-Administered Medications  Medication Dose Route Frequency Provider Last Rate Last Dose  . acetaminophen (TYLENOL) tablet 650 mg  650 mg Oral Q6H PRN Harriet Butte, NP      . alum & mag hydroxide-simeth (MAALOX/MYLANTA) 200-200-20 MG/5ML suspension 30 mL  30 mL Oral Q4H PRN Harriet Butte, NP   30 mL at 07/28/15 1949  . citalopram (CELEXA) tablet 10 mg  10 mg Oral Daily Jenne Campus, MD   10 mg at 07/29/15 0807  . diphenhydrAMINE (BENADRYL) capsule 25 mg  25 mg Oral Q12H PRN Jenne Campus, MD   25 mg at 07/29/15 7001  . magnesium hydroxide (MILK OF MAGNESIA) suspension 30 mL  30 mL Oral Daily PRN Harriet Butte, NP      . traZODone (DESYREL) tablet 50 mg  50 mg  Oral QHS PRN Jenne Campus, MD   50 mg at 07/28/15 2327    Lab Results: No results found for this or any previous visit (from the past 12 hour(s)).  Physical Findings: AIMS: Facial and Oral Movements Muscles of Facial Expression: None, normal Lips and Perioral Area: None, normal Jaw: None, normal Tongue: None, normal,Extremity Movements Upper (arms, wrists, hands, fingers): None, normal Lower (legs, knees, ankles, toes): None, normal, Trunk Movements Neck, shoulders, hips: None, normal, Overall Severity Severity of abnormal movements (highest score from questions above): None, normal Incapacitation due to abnormal movements: None, normal Patient's awareness of abnormal  movements (rate only patient's report): No Awareness, Dental Status Current problems with teeth and/or dentures?: No Does patient usually wear dentures?: No  CIWA:    COWS:     Musculoskeletal: Strength & Muscle Tone: within normal limits Gait & Station: normal Patient leans: N/A  Psychiatric Specialty Exam: ROS denies headache, denies chest pain, denies shortness of breath  Blood pressure 93/79, pulse 102, temperature 98.1 F (36.7 C), temperature source Oral, resp. rate 16, height 5' 2.5" (1.588 m), weight 193 lb (87.544 kg), SpO2 99 %.Body mass index is 34.72 kg/(m^2).  General Appearance: Fairly Groomed  Engineer, water::  Good  Speech:  Normal Rate  Volume:  Decreased  Mood:    Depressed   Affect:   More depressed today, although does smile briefly at times   Thought Process:  Linear  Orientation:  Full (Time, Place, and Person)  Thought Content:  denies hallucinations, no delusions, not internally preoccupied  Suicidal Thoughts:  No- denies  suicidal ideations, and denies any plan or intention of  hurting self , contracts for safety on unit   Homicidal Thoughts:  No  Memory:  recent and remote grossly intact   Judgement:   Improving   Insight:  Fair  Psychomotor Activity:  Normal  Concentration:  Good  Recall:  Good  Fund of Knowledge:Good  Language: Good  Akathisia:  Negative  Handed:  Right  AIMS (if indicated):     Assets:  Desire for Improvement Physical Health Resilience  ADL's:  Intact  Cognition: WNL  Sleep:  Number of Hours: 6  Assessment - Patient reports feeling more depressed today after breaking up with BF yesterday and after a phone conversation with her aunt , during which her aunt was unsupportive. She has had some fleeting thoughts of self cutting, no suicidal ideations, but has not acted out and is able to contract for safety at this time. Slept poorly last night . No current  Medication side effects.  Treatment Plan Summary: Daily contact with  patient to assess and evaluate symptoms and progress in treatment, Medication management, Plan inpatient treatment  and medications as below Continue to encourage milieu, group participation to work on coping skills and symptom reduction Increase  CELEXA to 20  mgrs QAM for depression Increase  TRAZODONE to  100  mgrs QHS PRN for insomnia as needed ( states she slept better on this  Higher dose )  Continue  BENADRYL  25  mgrs  BID  PRN for anxiety, as needed  Valita Righter 07/29/2015, 8:21 AM

## 2015-07-30 MED ORDER — TRAZODONE HCL 150 MG PO TABS
75.0000 mg | ORAL_TABLET | Freq: Every evening | ORAL | Status: DC | PRN
  Administered 2015-07-30 – 2015-08-02 (×4): 75 mg via ORAL
  Filled 2015-07-30 (×4): qty 1

## 2015-07-30 MED ORDER — HYDROXYZINE HCL 25 MG PO TABS
25.0000 mg | ORAL_TABLET | Freq: Two times a day (BID) | ORAL | Status: DC | PRN
  Administered 2015-07-30 – 2015-08-01 (×2): 25 mg via ORAL
  Filled 2015-07-30 (×2): qty 1

## 2015-07-30 NOTE — Tx Team (Signed)
Interdisciplinary Treatment Plan Update (Adult) Date: 07/30/2015    Time Reviewed: 9:30 AM  Progress in Treatment: Attending groups: Yes Participating in groups: Yes Taking medication as prescribed: Yes Tolerating medication: Yes Family/Significant other contact made: Yes, CSW has spoken with boyfriend Patient understands diagnosis: Yes Discussing patient identified problems/goals with staff: Yes Medical problems stabilized or resolved: Yes Denies suicidal/homicidal ideation: Yes Issues/concerns per patient self-inventory: Yes Other:  New problem(s) identified: N/A  Discharge Plan or Barriers: She is considering homeless shelters in [place] as she can no longer return home with boyfriend due to breakup.   Reason for Continuation of Hospitalization:  Depression Anxiety Medication Stabilization   Comments: N/A  Estimated length of stay: 2-3 days   Patient is a 18 year old Caucasian female admitted for depression and SI. Patient will benefit from crisis stabilization, medication evaluation, group therapy, and psycho education in addition to case management for discharge planning. Patient and CSW reviewed pt's identified goals and treatment plan. Pt verbalized understanding and agreed to treatment plan.     Review of initial/current patient goals per problem list:  1. Goal(s): Patient will participate in aftercare plan   Met: Goal progressing   Target date: 3-5 days post admission date   As evidenced by: Patient will participate within aftercare plan AEB aftercare provider and housing plan at discharge being identified.  12/13: Goal met: Patient plans to return home with outpatient services  12/16: Goal progressing. She is considering homeless shelters in [place] as she can   no longer return home with boyfriend due to breakup.    2. Goal (s): Patient will exhibit decreased depressive symptoms and  suicidal ideations.   Met: Yes   Target date: 3-5 days post admission date   As evidenced by: Patient will utilize self rating of depression at 3 or below and demonstrate decreased signs of depression or be deemed stable for discharge by MD.  12/13: Goal Progressing. Patient reports improvement in depressive symptoms, denies SI.  12/14: Goal met: Patient rates depression at 2-3 today.    3. Goal(s): Patient will demonstrate decreased signs and symptoms of anxiety.   Met: Yes   Target date: 3-5 days post admission date   As evidenced by: Patient will utilize self rating of anxiety at 3 or below and demonstrated decreased signs of anxiety, or be deemed stable for discharge by MD  12/13; Goal Progressing. Patient reports an improvement in anxiety symptoms.  12/14: Goal met. Patient rates anxiety at 1 today.    Attendees:  Patient:    Family:    Physician: Dr. Parke Poisson, Dr. Shea Evans, MD  07/30/2015 9:30 AM  Nursing: Drake Leach, Idell Pickles, Mayra Neer, RN  07/30/2015 9:30 AM  Clinical Social Worker Erasmo Downer Greidy Sherard, Peri Maris, LCSWA; [place], LCSW 07/30/2015 9:30 AM  Other:  07/30/2015 9:30 AM  Clinical: Agustina Caroli, NP 07/30/2015 9:30 AM  Other: Lars Pinks, CM 07/30/2015 9:30 AM  Other:              Scribe for Treatment Team:  Tilden Fossa, MSW, [employer] [phone]

## 2015-07-30 NOTE — [facility] Group Notes (Signed)
   [facility] Note  07/30/2015  8:45 AM   Participation Quality: Alert, Appropriate and Oriented  Mood/Affect: Lethargic   Depression Rating: 0  Anxiety Rating: 3  Thoughts of Suicide: Pt denies SI/HI  Will you contract for safety? Yes  Current AVH: Pt denies  Plan for Discharge/Comments: Pt attended discharge planning group and actively participated in group. CSW provided pt with today's workbook. Patient reports that she can no longer return to her boyfriend's apartment at discharge. [place] homeless [place] [facility].   Transportation Means: Pt reports access to transportation by bus  Supports: No supports mentioned at this time  Samuella BruinKristin Cordaro Mukai, MSW, [employer]CSWA Clinical Social Worker [facility] [phone]

## 2015-07-30 NOTE — Progress Notes (Signed)
Adult Psychoeducational Group Note  Date:  07/30/2015 Time:  8:58 PM   Group Topic/Focus:  Wrap-Up Group:   The focus of this group is to help patients review their daily goal of treatment and discuss progress on daily workbooks.  Participation Level:  Active  Participation Quality:  Appropriate  Affect:  Appropriate  Cognitive:  Alert  Insight: Appropriate  Engagement in Group:  Engaged  Modes of Intervention:  Discussion  Additional Comments:  Pt stated that her goal for today was to acknowledge bad feelings and to deal with them instead of ignore them.   Kaleen OdeaCOOKE, Talea Manges R 07/30/2015, 8:58 PM

## 2015-07-30 NOTE — Progress Notes (Signed)
D: Pt has anxious affect and mood.  Pt reports she is "doing pretty good."  She reports her goal is to "acknowledge any bad feelings I may have and cope with them accordingly."  Pt reports she did not have to do this today because "I haven't had any bad feelings."  Pt reports the best part of her day was "going to the gym."  Pt denies SI/HI, denies hallucinations, denies pain.  Pt has been visible in milieu and she attended evening group.   A: Met with pt 1:1 and offered support and encouragement.  PRN medication administered for sleep.  Writer asked pt to remain on her hallway, as pt kept trying to go to 300 hall to talk with some of the female patients on that hall.  Pt agreed to do so and she interacted with her peers on 400 afterwards.   R: Pt is compliant with medications.  Pt verbally contracts for safety.  Will continue to monitor and assess.

## 2015-07-30 NOTE — [facility] Group Notes (Signed)
[facility] LCSW Group Therapy 07/30/2015 1:15 PM Type of Therapy: Group Therapy Participation Level: Active  Participation Quality: Attentive, Sharing and Supportive  Affect: Appropriate  Cognitive: Alert and Oriented  Insight: Developing/Improving and Engaged  Engagement in Therapy: Developing/Improving and Engaged  Modes of Intervention: Clarification, Confrontation, Discussion, Education, Exploration, Limit-setting, Orientation, Problem-solving, Rapport Building, Dance movement psychotherapisteality Testing, Socialization and Support  Summary of Progress/Problems: The topic for today was feelings about relapse. Pt discussed what relapse prevention is to them and identified triggers that they are on the path to relapse. Pt processed their feeling towards relapse and was able to relate to peers. Pt discussed coping skills that can be used for relapse prevention. Patient identified her relapse behavior as cutting which was triggered by grief and feeling like no one cared about her. Patient reports "feeling pain is better than feeling nothing". She identified the negative consequences of her behavior as strained relationships with family/boyfriend and the possibility of negative health consequences. CSW and other group members provided patient with emotional support and encouragement.   Carolyn Gibbs, MSW, [employer]CSWA Clinical Social Worker [facility] [phone]

## 2015-07-30 NOTE — Plan of Care (Signed)
Problem: Diagnosis: Increased Risk For Suicide Attempt Goal: STG-Patient Will Attend All Groups On The Unit Outcome: Progressing Pt attended evening group on 07/29/15.     

## 2015-07-30 NOTE — Progress Notes (Signed)
D: Patient reports decreased depressive symptoms; denies AVH/HI.  Her goal today is to "cope better."  She rates her depression and hopelessness as a 0; anxiety as a 3.  She complained of some left ear pain which she states is due to some fluid in her ear.  Patient is pleasant; appears to be childlike in nature.  She states she is sleeping and eating well. A: Continue to monitor medication management and MD orders.  Safety checks completed every 15 minutes per protocol.  Offer support and encouragement as needed. R: Patient's behavior is appropriate to situation.

## 2015-07-30 NOTE — Progress Notes (Signed)
D: Pt has appropriate affect and anxious mood.  She reports her day was "kind of rough, just a lot of drama with my family and my now ex-boyfriend."  Pt reports she has had thoughts of self-harm "a couple" times.  She denies having a plan and she verbally contracts for safety.  Pt denies HI, denies hallucinations, denies pain.  Pt has been visible in milieu interacting with peers and staff appropriately.  Pt attended evening group.   A: Introduced self to pt.  Met with pt 1:1 and provided support and encouragement.  Actively listened to pt.  PRN medication administered for sleep. R: Pt is compliant with medications.  Pt verbally contracts for safety.  Will continue to monitor and assess.

## 2015-07-30 NOTE — Progress Notes (Signed)
Patient ID: Carolyn Gibbs, female   DOB: February 22, 1996, 19 y.o.   MRN: [id number] [facility] MD Progress Note  07/30/2015 12:14 PM Luvada Salamone  MRN:  [id number] Subjective:   Patient states she is feeling less depressed today, but still does not feel " like I am ready to discharge because of all that has happened". She states that she recently broke up with her BF, and that her aunt is no longer supportive, which she states means she is now homeless . She is future oriented, and has spoken to CSW about options, such as local shelters.   Objective : I have discussed case with treatment team and have met with patient. Patient reports feeling less depressed, in spite of significant /ongoing psychosocial stressors as above . She states she is tolerating medications well, except for some AM sedation, which may be related to Trazodone. She states , however, that she is sleeping well at current dose of 100 mgrs QHS and that she slept only fairly at 50 mgrs QHS.  Denies any other medication side effects. States  had some short lived  passive SI thoughts yesterday, but today denies any suicidal ideations or self injurious ideations . She is not presenting with any disruptive or agitated behaviors on unit. Going to groups, visible on unit .  Principal Problem: Major depressive disorder, recurrent, severe without psychotic features ([place]) Diagnosis:   Patient Active Problem List   Diagnosis Date Noted  . Major depressive disorder, recurrent, severe without psychotic features ([place]) [F33.2] 07/26/2015  . Severe recurrent major depression without psychotic features ([place]) [F33.2] 07/25/2015  . Depression with suicidal ideation [F32.9, R45.851]    Total Time spent with patient:  25 minutes   Past Medical History:  Past Medical History  Diagnosis Date  . Hashimoto's thyroiditis   . Asthma    History reviewed. No pertinent past surgical history. Family History: History reviewed. No pertinent family  history.  Social History:  History  Alcohol Use No     History  Drug Use No    Social History   Social History  . Marital Status: Single    Spouse Name: N/A  . Number of Children: N/A  . Years of Education: N/A   Social History Main Topics  . Smoking status: Never Smoker   . Smokeless tobacco: None  . Alcohol Use: No  . Drug Use: No  . Sexual Activity: Yes   Other Topics Concern  . None   Social History Narrative   Additional Social History:    History of alcohol / drug use?: No history of alcohol / drug abuse  Sleep:  Improved   Appetite:  Good  Current Medications: Current Facility-Administered Medications  Medication Dose Route Frequency Provider Last Rate Last Dose  . acetaminophen (TYLENOL) tablet 650 mg  650 mg Oral Q6H PRN Harriet Butte, NP      . alum & mag hydroxide-simeth (MAALOX/MYLANTA) 200-200-20 MG/5ML suspension 30 mL  30 mL Oral Q4H PRN Harriet Butte, NP   30 mL at 07/28/15 1949  . citalopram (CELEXA) tablet 20 mg  20 mg Oral Daily Jenne Campus, MD   20 mg at 07/30/15 0749  . diphenhydrAMINE (BENADRYL) capsule 25 mg  25 mg Oral Q12H PRN Jenne Campus, MD   25 mg at 07/29/15 9381  . magnesium hydroxide (MILK OF MAGNESIA) suspension 30 mL  30 mL Oral Daily PRN Harriet Butte, NP      . traZODone (DESYREL) tablet 75 mg  75 mg Oral QHS PRN Jenne Campus, MD        Lab Results: No results found for this or any previous visit (from the past 48 hour(s)).  Physical Findings: AIMS: Facial and Oral Movements Muscles of Facial Expression: None, normal Lips and Perioral Area: None, normal Jaw: None, normal Tongue: None, normal,Extremity Movements Upper (arms, wrists, hands, fingers): None, normal Lower (legs, knees, ankles, toes): None, normal, Trunk Movements Neck, shoulders, hips: None, normal, Overall Severity Severity of abnormal movements (highest score from questions above): None, normal Incapacitation due to abnormal movements:  None, normal Patient's awareness of abnormal movements (rate only patient's report): No Awareness, Dental Status Current problems with teeth and/or dentures?: No Does patient usually wear dentures?: No  CIWA:    COWS:     Musculoskeletal: Strength & Muscle Tone: within normal limits Gait & Station: normal Patient leans: N/A  Psychiatric Specialty Exam: ROS denies headache, denies chest pain, denies shortness of breath  Blood pressure 123/74, pulse 117, temperature 98 F (36.7 C), temperature source Oral, resp. rate 16, height 5' 2.5" (1.588 m), weight 193 lb (87.544 kg), SpO2 99 %.Body mass index is 34.72 kg/(m^2).  General Appearance: Fairly Groomed  Engineer, water::  Good  Speech:  Normal Rate  Volume:  Decreased  Mood:    Less depressed   Affect:   Still constricted , but reactive   Thought Process:  Linear  Orientation:  Full (Time, Place, and Person)  Thought Content:  denies hallucinations, no delusions, not internally preoccupied  Suicidal Thoughts:  No-  Today denies  suicidal ideations, and denies any current  plan or intention of  hurting self , contracts for safety on unit   Homicidal Thoughts:  No  Memory:  recent and remote grossly intact   Judgement:   Improving   Insight:  Fair  Psychomotor Activity:  Normal  Concentration:  Good  Recall:  Good  Fund of Knowledge:Good  Language: Good  Akathisia:  Negative  Handed:  Right  AIMS (if indicated):     Assets:  Desire for Improvement Physical Health Resilience  ADL's:  Intact  Cognition: WNL  Sleep:  Number of Hours: 6  Assessment - patient reports partial improvement of mood , and feels less depressed, although does present with a somewhat constricted affect and ruminations about severe psychosocial stressors. States that due to recent break up with BF and no current support from [place], she is currently homeless.  Denies any suicidal ideations or self cutting ideations at this time. Tolerating medications well, but  may be experiencing some AM sedation from Trazodone. However, does not want to stop this medication because she is sleeping better on it . Treatment Plan Summary: Daily contact with patient to assess and evaluate symptoms and progress in treatment, Medication management, Plan inpatient treatment  and medications as below Continue to encourage milieu, group participation to work on coping skills and symptom reduction Continue  CELEXA  20  mgrs QAM for depression Change   TRAZODONE to  75  mgrs QHS PRN for insomnia as needed  Continue  BENADRYL  25  mgrs  BID  PRN for anxiety, as needed  Treatment Team working on disposition options [place], [place] 07/30/2015, 12:14 PM

## 2015-07-30 NOTE — Plan of Care (Signed)
Problem: Alteration in mood Goal: LTG-Patient reports reduction in suicidal thoughts (Patient reports reduction in suicidal thoughts and is able to verbalize a safety plan for whenever patient is feeling suicidal)  Outcome: Progressing Pt denies SI and verbally contracts for safety.       

## 2015-07-31 DIAGNOSIS — F332 Major depressive disorder, recurrent severe without psychotic features: Principal | ICD-10-CM

## 2015-07-31 MED ORDER — LORATADINE 10 MG PO TABS
10.0000 mg | ORAL_TABLET | Freq: Every day | ORAL | Status: DC
  Administered 2015-08-01 – 2015-08-03 (×3): 10 mg via ORAL
  Filled 2015-07-31 (×5): qty 1

## 2015-07-31 MED ORDER — SALINE SPRAY 0.65 % NA SOLN
NASAL | Status: AC
  Administered 2015-07-31: 22:00:00
  Filled 2015-07-31: qty 44

## 2015-07-31 MED ORDER — SALINE SPRAY 0.65 % NA SOLN
1.0000 | NASAL | Status: DC | PRN
  Administered 2015-08-03: 1 via NASAL

## 2015-07-31 NOTE — Progress Notes (Signed)
Adult Psychoeducational Group Note  Date:  07/31/2015 Time:  8:55 PM  Group Topic/Focus:  Wrap-Up Group:   The focus of this group is to help patients review their daily goal of treatment and discuss progress on daily workbooks.  Participation Level:  Active  Participation Quality:  Appropriate  Affect:  Appropriate  Cognitive:  Alert  Insight: Appropriate  Engagement in Group:  Engaged  Modes of Intervention:  Discussion  Additional Comments: Pt participated in a group game.   Kaleen OdeaCOOKE, Vassie Kugel R 07/31/2015, 8:55 PM

## 2015-07-31 NOTE — Progress Notes (Signed)
D) Pt has been attending the groups and interacting with her peers. Affect is pleasant. Pt rates her depression, hopelessness and anxiety all at a 0. Denies SI and HI. A) Given support, reassurance and praise along with encouragement. Provided with a brief 1:1. R) Denies SI and HI.
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
## 2015-07-31 NOTE — Progress Notes (Signed)
Patient ID: Carolyn Gibbs, female   DOB: [birth year], 19 y.o.   MRN: [id number] [facility] MD Progress Note  07/31/2015 11:59 AM Carolyn Gibbs  MRN:  [id number] Subjective:   Patient states she is feeling less depressed,. Initially presented with cutting behaviour, breakup and feels Aunt is not supportive.  Tolerating medications. Objective : I have discussed case with treatment team and have met with patient. Patient reports feeling less depressed, but she does understand she is in a supportive environment in hospital.  Her trazadone was lowered due to morning sedation. Still feels somewhat sedated.  Denies any other medication side effects. States  had some sort of passive hopelessness toughts but no sucidal plan and today denies any suicidal ideations or self injurious ideations . She is not presenting with any disruptive or agitated behaviors on unit. Going to groups, visible on unit .  Principal Problem: Major depressive disorder, recurrent, severe without psychotic features ([place]) Diagnosis:   Patient Active Problem List   Diagnosis Date Noted  . Major depressive disorder, recurrent, severe without psychotic features ([place]) [F33.2] 07/26/2015  . Severe recurrent major depression without psychotic features ([place]) [F33.2] 07/25/2015  . Depression with suicidal ideation [F32.9, R45.851]    Total Time spent with patient:  25 minutes   Past Medical History:  Past Medical History  Diagnosis Date  . Hashimoto's thyroiditis   . Asthma    History reviewed. No pertinent past surgical history. Family History: History reviewed. No pertinent family history.  Social History:  History  Alcohol Use No     History  Drug Use No    Social History   Social History  . Marital Status: Single    Spouse Name: N/A  . Number of Children: N/A  . Years of Education: N/A   Social History Main Topics  . Smoking status: Never Smoker   . Smokeless tobacco: None  . Alcohol Use: No  . Drug Use: No  .  Sexual Activity: Yes   Other Topics Concern  . None   Social History Narrative   Additional Social History:    History of alcohol / drug use?: No history of alcohol / drug abuse  Sleep:  Improved   Appetite:  Good  Current Medications: Current Facility-Administered Medications  Medication Dose Route Frequency Provider Last Rate Last Dose  . acetaminophen (TYLENOL) tablet 650 mg  650 mg Oral Q6H PRN Harriet Butte, NP      . alum & mag hydroxide-simeth (MAALOX/MYLANTA) 200-200-20 MG/5ML suspension 30 mL  30 mL Oral Q4H PRN Harriet Butte, NP   30 mL at 07/28/15 1949  . citalopram (CELEXA) tablet 20 mg  20 mg Oral Daily Jenne Campus, MD   20 mg at 07/31/15 2751  . diphenhydrAMINE (BENADRYL) capsule 25 mg  25 mg Oral Q12H PRN Jenne Campus, MD   25 mg at 07/29/15 7001  . hydrOXYzine (ATARAX/VISTARIL) tablet 25 mg  25 mg Oral BID PRN Harriet Butte, NP   25 mg at 07/30/15 2025  . magnesium hydroxide (MILK OF MAGNESIA) suspension 30 mL  30 mL Oral Daily PRN Harriet Butte, NP      . traZODone (DESYREL) tablet 75 mg  75 mg Oral QHS PRN Jenne Campus, MD   75 mg at 07/30/15 2126    Lab Results: No results found for this or any previous visit (from the past 31 hour(s)).  Physical Findings: AIMS: Facial and Oral Movements Muscles of Facial Expression: None, normal Lips  and Perioral Area: None, normal Jaw: None, normal Tongue: None, normal,Extremity Movements Upper (arms, wrists, hands, fingers): None, normal Lower (legs, knees, ankles, toes): None, normal, Trunk Movements Neck, shoulders, hips: None, normal, Overall Severity Severity of abnormal movements (highest score from questions above): None, normal Incapacitation due to abnormal movements: None, normal Patient's awareness of abnormal movements (rate only patient's report): No Awareness, Dental Status Current problems with teeth and/or dentures?: No Does patient usually wear dentures?: No  CIWA:    COWS:      Musculoskeletal: Strength & Muscle Tone: within normal limits Gait & Station: normal Patient leans: N/A  Psychiatric Specialty Exam: Review of Systems  Constitutional: Negative.   Psychiatric/Behavioral: Positive for depression. Negative for substance abuse.   denies headache, denies chest pain, denies shortness of breath  Blood pressure 122/68, pulse 107, temperature 98.5 F (36.9 C), temperature source Oral, resp. rate 16, height 5' 2.5" (1.588 m), weight 87.544 kg (193 lb), SpO2 99 %.Body mass index is 34.72 kg/(m^2).  General Appearance: Fairly Groomed  Engineer, water::  Good  Speech:  Normal Rate  Volume:  Decreased  Mood:    Less depressed   Affect:   Still constricted , but reactive   Thought Process:  Linear  Orientation:  Full (Time, Place, and Person)  Thought Content:  denies hallucinations, no delusions, not internally preoccupied  Suicidal Thoughts:  No-  Today denies  suicidal ideations, and denies any current  plan or intention of  hurting self , contracts for safety on unit   Homicidal Thoughts:  No  Memory:  recent and remote grossly intact   Judgement:   Improving   Insight:  Fair  Psychomotor Activity:  Normal  Concentration:  Good  Recall:  Good  Fund of Knowledge:Good  Language: Good  Akathisia:  Negative  Handed:  Right  AIMS (if indicated):     Assets:  Desire for Improvement Physical Health Resilience  ADL's:  Intact  Cognition: WNL  Sleep:  Number of Hours: 6.75  Assessment - patient reports partial improvement of mood , and feels less depressed, still not sure of how she will handle her stressors when home.   Treatment Plan Summary: Daily contact with patient to assess and evaluate symptoms and progress in treatment, Medication management, Plan inpatient treatment  and medications as below Continue to encourage milieu, group participation to work on coping skills and symptom reduction Continue  CELEXA  20  mgrs QAM for depression Change    TRAZODONE to  75  mgrs QHS PRN for insomnia as needed  Continue  BENADRYL  25  mgrs  BID  PRN for anxiety, as needed  Treatment Team working on disposition options Carolyn Gibbs 07/31/2015, 11:59 AM

## 2015-08-01 NOTE — Progress Notes (Signed)
Patient ID: Carolyn Gibbs, female   DOB: Oct 10, 1995, 19 y.o.   MRN: [id number] [facility] MD Progress Note  08/01/2015 10:57 AM Carolyn Gibbs  MRN:  [id number] Subjective:   Patient states she is feeling less depressed,. Initially presented with cutting behaviour, breakup and feels Aunt is not supportive.  Tolerating medications. Still somewhat sedate during the day.  Objective : I have discussed case with treatment team and have met with patient. Patient reports feeling less depressed, but she does understand she is in a supportive environment in hospital.  Less daytime sedation.  Denies any other medication side effects. States  had some sort of passive hopelessness toughts but no sucidal plan and today denies any suicidal ideations or self injurious ideations . She is not presenting with any disruptive or agitated behaviors on unit. Going to groups, visible on unit .  Principal Problem: Major depressive disorder, recurrent, severe without psychotic features ([place]) Diagnosis:   Patient Active Problem List   Diagnosis Date Noted  . Major depressive disorder, recurrent, severe without psychotic features ([place]) [F33.2] 07/26/2015  . Severe recurrent major depression without psychotic features ([place]) [F33.2] 07/25/2015  . Depression with suicidal ideation [F32.9, R45.851]    Total Time spent with patient:  25 minutes   Past Medical History:  Past Medical History  Diagnosis Date  . Hashimoto's thyroiditis   . Asthma    History reviewed. No pertinent past surgical history. Family History: History reviewed. No pertinent family history.  Social History:  History  Alcohol Use No     History  Drug Use No    Social History   Social History  . Marital Status: Single    Spouse Name: N/A  . Number of Children: N/A  . Years of Education: N/A   Social History Main Topics  . Smoking status: Never Smoker   . Smokeless tobacco: None  . Alcohol Use: No  . Drug Use: No  . Sexual Activity:  Yes   Other Topics Concern  . None   Social History Narrative   Additional Social History:    History of alcohol / drug use?: No history of alcohol / drug abuse  Sleep:  Improved   Appetite:  Good  Current Medications: Current Facility-Administered Medications  Medication Dose Route Frequency Provider Last Rate Last Dose  . acetaminophen (TYLENOL) tablet 650 mg  650 mg Oral Q6H PRN Harriet Butte, NP      . alum & mag hydroxide-simeth (MAALOX/MYLANTA) 200-200-20 MG/5ML suspension 30 mL  30 mL Oral Q4H PRN Harriet Butte, NP   30 mL at 07/28/15 1949  . citalopram (CELEXA) tablet 20 mg  20 mg Oral Daily Jenne Campus, MD   20 mg at 08/01/15 0804  . diphenhydrAMINE (BENADRYL) capsule 25 mg  25 mg Oral Q12H PRN Jenne Campus, MD   25 mg at 07/29/15 9983  . hydrOXYzine (ATARAX/VISTARIL) tablet 25 mg  25 mg Oral BID PRN Harriet Butte, NP   25 mg at 07/30/15 2025  . loratadine (CLARITIN) tablet 10 mg  10 mg Oral Daily Dara Hoyer, PA-C   10 mg at 08/01/15 3825  . magnesium hydroxide (MILK OF MAGNESIA) suspension 30 mL  30 mL Oral Daily PRN Harriet Butte, NP      . sodium chloride (OCEAN) 0.65 % nasal spray 1 spray  1 spray Each Nare PRN Dara Hoyer, PA-C      . traZODone (DESYREL) tablet 75 mg  75 mg Oral QHS  PRN Jenne Campus, MD   75 mg at 07/31/15 2136    Lab Results: No results found for this or any previous visit (from the past 48 hour(s)).  Physical Findings: AIMS: Facial and Oral Movements Muscles of Facial Expression: None, normal Lips and Perioral Area: None, normal Jaw: None, normal Tongue: None, normal,Extremity Movements Upper (arms, wrists, hands, fingers): None, normal Lower (legs, knees, ankles, toes): None, normal, Trunk Movements Neck, shoulders, hips: None, normal, Overall Severity Severity of abnormal movements (highest score from questions above): None, normal Incapacitation due to abnormal movements: None, normal Patient's awareness of  abnormal movements (rate only patient's report): No Awareness, Dental Status Current problems with teeth and/or dentures?: No Does patient usually wear dentures?: No  CIWA:    COWS:     Musculoskeletal: Strength & Muscle Tone: within normal limits Gait & Station: normal Patient leans: N/A  Psychiatric Specialty Exam: Review of Systems  Constitutional: Negative.   Psychiatric/Behavioral: Positive for depression. Negative for substance abuse.   denies headache, denies chest pain, denies shortness of breath  Blood pressure 126/71, pulse 95, temperature 98.1 F (36.7 C), temperature source Oral, resp. rate 18, height 5' 2.5" (1.588 m), weight 87.544 kg (193 lb), SpO2 99 %.Body mass index is 34.72 kg/(m^2).  General Appearance: Fairly Groomed  Engineer, water::  Good  Speech:  Normal Rate  Volume:  Decreased  Mood:   Less dysphoric  Affect:   More reactive  Thought Process:  Linear  Orientation:  Full (Time, Place, and Person)  Thought Content:  denies hallucinations, no delusions, not internally preoccupied  Suicidal Thoughts:  No-  Today denies  suicidal ideations, and denies any current  plan or intention of  hurting self , contracts for safety on unit   Homicidal Thoughts:  No  Memory:  recent and remote grossly intact   Judgement:   Improving   Insight:  Fair  Psychomotor Activity:  Normal  Concentration:  Good  Recall:  Good  Fund of Knowledge:Good  Language: Good  Akathisia:  Negative  Handed:  Right  AIMS (if indicated):     Assets:  Desire for Improvement Physical Health Resilience  ADL's:  Intact  Cognition: WNL  Sleep:  Number of Hours: 5.75  Assessment - patient reports partial improvement of mood , and feels less depressed, still not sure of how she will handle her stressors when home.   Treatment Plan Summary: Daily contact with patient to assess and evaluate symptoms and progress in treatment, Medication management, Plan inpatient treatment  and medications as  below Continue to encourage milieu, group participation to work on coping skills and symptom reduction Continue  CELEXA  20  mgrs QAM for depression Continue  TRAZODONE to  75  mgrs QHS PRN for insomnia as needed . Can lower dose if sedation during the day persist by tomorrow.  Continue  BENADRYL  25  mgrs  BID  PRN for anxiety, as needed  Treatment Team working on disposition options Jd Mccaster 08/01/2015, 10:57 AM
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
## 2015-08-01 NOTE — Progress Notes (Signed)
D. Pt had been up and visible in milieu, did attend and participate in evening group activity. Pt also seen interacting appropriately with peers. Pt still did endorse feeling depressed but reports feeling a little better and able to contract for safety in the hospital. Pt did receive bedtime medication without incident and did not verbalize any complaints of pain. A. Support and encouragement provided. R. Safety maintained, will continue to monitor.

## 2015-08-01 NOTE — Progress Notes (Signed)
Adult Psychoeducational Group Note  Date:  08/01/2015 Time:  9:16 PM  Group Topic/Focus:  Wrap-Up Group:   The focus of this group is to help patients review their daily goal of treatment and discuss progress on daily workbooks.  Participation Level:  Minimal  Participation Quality:  Sharing  Affect:  Appropriate and Defensive  Cognitive:  Appropriate  Insight: Lacking  Engagement in Group:  Off Topic  Modes of Intervention:  Education  Additional Comments:  Pt mentioned her day was better because her ex-boyfriend visited her. Pt was off topic during group and only wanted to focus on her conversation with her ex-boyfriend. Pt plans to discharge and move with him so she wont be homeless.   Merlinda FrederickKeshia S Braydyn Schultes 08/01/2015, 9:16 PM

## 2015-08-01 NOTE — [facility] Group Notes (Signed)
[facility] Group Notes:  (Nursing/MHT/Case Management/Adjunct)  Date:  07/31/2015  Time: 1000  Type of Therapy:  Nurse Education  ; The group is focused on teaching patients how to identify their needs as well as teaching them what behaviors ae required to get their needs met.  Participation Level:  Did Not Attend  Participation Quality:  N/A  Affect:  N/A  Cognitive:  N/A  Insight:  N/A  Engagement in Group: N/A   Modes of Intervention:N/A    Summary of Progress/Problems:N/A  Rob Hickman, Trixie Rude Late entry made 12 / 8 / 2016 for 07/31/2015

## 2015-08-01 NOTE — Progress Notes (Signed)
D) Pt has been sleepy today. States that she is not sleeping very well at night. States that she is feeling better overall. Rates her depression at a 0, her hopelessness at a 0 and her anxiety at a 2. Has tried to attend all the groups today. Affect is appropriate with interaction A) Given support, reassurance and praise. Provided with a brief 1:1. Encouragement given R) Denies SI and HI.

## 2015-08-02 NOTE — [facility] Group Notes (Signed)
[facility] LCSW Group Therapy 08/02/2015  1:15 pm  Type of Therapy: Group Therapy Participation Level: Active  Participation Quality: Attentive, Sharing and Supportive  Affect: Blunted  Cognitive: Alert and Oriented  Insight: Developing/Improving and Engaged  Engagement in Therapy: Developing/Improving and Engaged  Modes of Intervention: Clarification, Confrontation, Discussion, Education, Exploration,  Limit-setting, Orientation, Problem-solving, Rapport Building, Dance movement psychotherapisteality Testing, Socialization and Support  Summary of Progress/Problems: Pt identified obstacles faced currently and processed barriers involved in overcoming these obstacles. Pt identified steps necessary for overcoming these obstacles and explored motivation (internal and external) for facing these difficulties head on. Pt further identified one area of concern in their lives and chose a goal to focus on for today. Patient identified rebuilding trust with her family as an obstacle. Patient reports that her aunt does not think she is stable because patient expressed feelings of anger to her aunt regarding not being able to live with aunt at discharge. Patient feels that her progress during hospitalization has been invalidated by her family. CSW and other group members provided patient with emotional support and encouragement.  Samuella BruinKristin Atina Feeley, MSW, [employer]CSWA Clinical Social Worker [facility] [phone]

## 2015-08-02 NOTE — Progress Notes (Signed)
Adult Psychoeducational Group Note  Date:  08/02/2015 Time:  10:08 PM  Group Topic/Focus:  Wrap-Up Group:   The focus of this group is to help patients review their daily goal of treatment and discuss progress on daily workbooks.  Participation Level:  Active  Participation Quality:  Appropriate and Attentive  Affect:  Appropriate  Cognitive:  Appropriate  Insight: Appropriate and Good  Engagement in Group:  Engaged  Modes of Intervention:  Education  Additional Comments:  Pt mentioned she felt sick today with a sore throat and feeling congested. Pt mentioned she might be discharging tomorrow but did not want to discharge if she is not feeling better.   Merlinda FrederickKeshia S Azarion Hove 08/02/2015, 10:08 PM

## 2015-08-02 NOTE — Progress Notes (Signed)
[facility]  08/02/2015 3:06 PM Carolyn Gibbs  MRN:  [id number]   Subjective:   Patient states she is "feeling sick with H/A and body aches.  Objective :Carolyn Gibbs is awake, alert and oriented X4 , found resting in her bed.  Denies suicidal or homicidal ideation. Denies auditory or visual hallucination and does not appear to be responding to internal stimuli. Patient reports interacting well with staff and others. Patient reports she is medication compliant without mediation side effects. Report learning new coping skills to deep breath. States her depression 2/10. Patient states "I am not feeling good today. States that she feels like she is starting to get a cold. Reports that she recently had a breakup with her boyfriend and is handling this situation "okay". Reports that her family/friends not supportive. States she will be discharged to the shelter. Reports good appetite and resting well. Patient report  Support, encouragement and reassurance was provided.   Principal Problem: Major depressive disorder, recurrent, severe without psychotic features ([facility]) Diagnosis:   Patient Active Problem List   Diagnosis Date Noted  . Major depressive disorder, recurrent, severe without psychotic features ([facility]) [F33.2] 07/26/2015  . Severe recurrent major depression without psychotic features ([facility]) [F33.2] 07/25/2015  . Depression with suicidal ideation [F32.9, R45.851]    Total Time spent with patient:  25 minutes   Past Medical History:  Past Medical History  Diagnosis Date  . Hashimoto's thyroiditis   . Asthma    History reviewed. No pertinent past surgical history. Family History: History reviewed. No pertinent family history.  Social History:  History  Alcohol Use No     History  Drug Use No    Social History   Social History  . Marital Status: Single    Spouse Name: N/A  . Number of Children: N/A  . Years of Education: N/A   Social History Main Topics  . Smoking  status: Never Smoker   . Smokeless tobacco: None  . Alcohol Use: No  . Drug Use: No  . Sexual Activity: Yes   Other Topics Concern  . None   Social History Narrative   Additional Social History:    History of alcohol / drug use?: No history of alcohol / drug abuse  Sleep:  Improved   Appetite:  Good  Current Medications: Current Facility-Administered Medications  Medication Dose Route Frequency Provider Last Rate Last Dose  . acetaminophen (TYLENOL) tablet 650 mg  650 mg Oral Q6H PRN Worthy FlankIjeoma E Nwaeze, NP   650 mg at 08/02/15 0823  . alum & mag hydroxide-simeth (MAALOX/MYLANTA) 200-200-20 MG/5ML suspension 30 mL  30 mL Oral Q4H PRN Worthy FlankIjeoma E Nwaeze, NP   30 mL at 07/28/15 1949  . citalopram (CELEXA) tablet 20 mg  20 mg Oral Daily Craige CottaFernando A Cobos, MD   20 mg at 08/02/15 0815  . diphenhydrAMINE (BENADRYL) capsule 25 mg  25 mg Oral Q12H PRN Craige CottaFernando A Cobos, MD   25 mg at 07/29/15 [id number]  . hydrOXYzine (ATARAX/VISTARIL) tablet 25 mg  25 mg Oral BID PRN Worthy FlankIjeoma E Nwaeze, NP   25 mg at 08/01/15 2109  . loratadine (CLARITIN) tablet 10 mg  10 mg Oral Daily Court Joyharles E Kober, PA-C   10 mg at 08/02/15 0815  . magnesium hydroxide (MILK OF MAGNESIA) suspension 30 mL  30 mL Oral Daily PRN Worthy FlankIjeoma E Nwaeze, NP      . sodium chloride (OCEAN) 0.65 % nasal spray 1 spray  1 spray Each Nare PRN  Court Joy, PA-C      . traZODone (DESYREL) tablet 75 mg  75 mg Oral QHS PRN Craige Cotta, MD   75 mg at 08/01/15 2109    Lab Results: No results found for this or any previous visit (from the past 48 hour(s)).  Physical Findings: AIMS: Facial and Oral Movements Muscles of Facial Expression: None, normal Lips and Perioral Area: None, normal Jaw: None, normal Tongue: None, normal,Extremity Movements Upper (arms, wrists, hands, fingers): None, normal Lower (legs, knees, ankles, toes): None, normal, Trunk Movements Neck, shoulders, hips: None, normal, Overall Severity Severity of abnormal movements  (highest score from questions above): None, normal Incapacitation due to abnormal movements: None, normal Patient's awareness of abnormal movements (rate only patient's report): No Awareness, Dental Status Current problems with teeth and/or dentures?: No Does patient usually wear dentures?: No  CIWA:    COWS:     Musculoskeletal: Strength & Muscle Tone: within normal limits Gait & Station: normal Patient leans: N/A  Psychiatric Specialty Exam: Review of Systems  Constitutional: Negative.   Psychiatric/Behavioral: Positive for depression. Negative for suicidal ideas and substance abuse. The patient is nervous/anxious.   All other systems reviewed and are negative.  denies headache, denies chest pain, denies shortness of breath  Blood pressure 117/61, pulse 108, temperature 98 F (36.7 C), temperature source Oral, resp. rate 16, height 5' 2.5" (1.588 m), weight 87.544 kg (193 lb), SpO2 99 %.Body mass index is 34.72 kg/(m^2).  General Appearance: Casual and Fairly Groomed  Eye Contact::  Good  Speech:  Normal Rate  Volume:  Decreased  Mood:   flat  Affect:   More reactive  Thought Process:  Goal Directed, Linear and Logical  Orientation:  Full (Time, Place, and Person)  Thought Content:  denies hallucinations, no delusions, not internally preoccupied  Suicidal Thoughts:  No-  Today denies  suicidal ideations, and denies any current  plan or intention of  hurting self , contracts for safety on unit   Homicidal Thoughts:  No  Memory:  recent and remote grossly intact   Judgement:   Improving   Insight:  Fair  Psychomotor Activity:  Normal  Concentration:  Good  Recall:  Good  Fund of Knowledge:Good  Language: Good  Akathisia:  Negative  Handed:  Right  AIMS (if indicated):     Assets:  Desire for Improvement Physical Health Resilience  ADL's:  Intact  Cognition: WNL  Sleep:  Number of Hours: 6.75      I agree with current treatment plan on 08/02/2015, Patient seen  face-to-face for psychiatric evaluation follow-up, chart reviewed and case discussed with Social Workers/ Treatment team. Reviewed the information documented and agree with the treatment plan.  Treatment Plan Summary:  Daily contact with patient to assess and evaluate symptoms and progress in treatment, Medication management, Plan inpatient treatment  and medications as below  Continue to encourage milieu, group participation to work on coping skills and symptom reduction Consider patient for discharge on 08/03/2015 Continue  CELEXA  20  mgrs QAM for depression Continue  TRAZODONE to  75  mgrs QHS PRN for insomnia as needed . Can lower dose if sedation during the day persist by tomorrow.  Continue  BENADRYL  25  mgrs  BID  PRN for anxiety, as needed  Treatment Team working on disposition options-   Oneta Rack FNP- [facility] 08/02/2015, 3:06 PM

## 2015-08-02 NOTE — [facility] Group Notes (Signed)
[facility] LCSW Aftercare Discharge Planning Group Note  08/02/2015  8:45 AM  Participation Quality: Did Not Attend. Patient invited to participate but declined.  Levy Wellman, MSW, LCSWA Clinical Social Worker [facility] [phone]   

## 2015-08-02 NOTE — Progress Notes (Signed)
D: Patient reports breaking up with boyfriend has been an learning experience they both needed to work on themselves. Pt mood and affect is appropriate to situation. Pt reports she is tolerating medication well. Pt denies SI/HI/AVH and pain. Pt attended and participated in evening wrap up group. Cooperative with assessment.  A: Met with pt 1:1. Medications administered as prescribed. Support and encouragement provided. R: Patient remains safe and complaint with medications.

## 2015-08-02 NOTE — Plan of Care (Signed)
Problem: Consults Goal: Depression Patient Education See Patient Education Module for education specifics.  Outcome: Progressing Nurse discussed depression/coping skills with patient.        

## 2015-08-02 NOTE — Plan of Care (Signed)
Problem: Alteration in mood Goal: STG-Patient reports thoughts of self-harm to staff Outcome: Progressing Pt denies SI intent or plan.

## 2015-08-02 NOTE — Progress Notes (Signed)
D:  Patient's self inventory sheet, patient has poor sleep, sleep medication was not helpful.  Fair appetite, low energy level, good concentration.  Denied depression, hopeless and anxiety.  Denied withdrawals.  Denied SI.  Physical problems, lightheaded, pain, headaches.  Pain, throat, had, back, worst pain #5.  Goal is to feel better.  Plans to talk to MD.  Does have discharge plans. A:  Medications administered per MD orders.  Emotional support and encouragement given patient. R:  Patient denied SI and HI, contracts for safety.  Denied A/V hallucinations.  Safety maintained with 15 minute checks.

## 2015-08-03 MED ORDER — CITALOPRAM HYDROBROMIDE 20 MG PO TABS: 20.0000 mg | ORAL_TABLET | Freq: Every day | ORAL | Status: AC

## 2015-08-03 MED ORDER — TRAZODONE HCL 150 MG PO TABS: 75.0000 mg | ORAL_TABLET | Freq: Every evening | ORAL | Status: AC | PRN

## 2015-08-03 MED ORDER — HYDROXYZINE HCL 25 MG PO TABS: 25.0000 mg | ORAL_TABLET | Freq: Two times a day (BID) | ORAL | Status: AC | PRN

## 2015-08-03 NOTE — Tx Team (Signed)
Interdisciplinary Treatment Plan Update (Adult) Date: 08/03/2015    Time Reviewed: 9:30 AM  Progress in Treatment: Attending groups: Yes Participating in groups: Yes Taking medication as prescribed: Yes Tolerating medication: Yes Family/Significant other contact made: Yes, CSW has spoken with boyfriend Patient understands diagnosis: Yes Discussing patient identified problems/goals with staff: Yes Medical problems stabilized or resolved: Yes Denies suicidal/homicidal ideation: Yes Issues/concerns per patient self-inventory: Yes Other:  New problem(s) identified: N/A  Discharge Plan or Barriers: Patient plans to discharge to [facility] with outpatient services.   Reason for Continuation of Hospitalization:  Depression Anxiety Medication Stabilization   Comments: N/A  Estimated length of stay: Discharge anticipated for 08/03/15.   Patient is a 19 year old Caucasian female admitted for depression and SI. Patient will benefit from crisis stabilization, medication evaluation, group therapy, and psycho education in addition to case management for discharge planning. Patient and CSW reviewed pt's identified goals and treatment plan. Pt verbalized understanding and agreed to treatment plan.     Review of initial/current patient goals per problem list:  1. Goal(s): Patient will participate in aftercare plan   Met: Yes   Target date: 3-5 days post admission date   As evidenced by: Patient will participate within aftercare plan AEB aftercare provider and housing plan at discharge being identified.  12/13: Goal met: Patient plans to return home with outpatient services  12/16: Goal progressing. She is considering homeless shelters in [place] as she can no longer return home with boyfriend due to breakup.   12/20: Goal met: Patient plans to discharge to the [facility] with outpatient services.    2. Goal (s): Patient  will exhibit decreased depressive symptoms and suicidal ideations.   Met: Yes   Target date: 3-5 days post admission date   As evidenced by: Patient will utilize self rating of depression at 3 or below and demonstrate decreased signs of depression or be deemed stable for discharge by MD.  12/13: Goal Progressing. Patient reports improvement in depressive symptoms, denies SI.  12/14: Goal met: Patient rates depression at 2-3 today.    3. Goal(s): Patient will demonstrate decreased signs and symptoms of anxiety.   Met: Yes   Target date: 3-5 days post admission date   As evidenced by: Patient will utilize self rating of anxiety at 3 or below and demonstrated decreased signs of anxiety, or be deemed stable for discharge by MD  12/13; Goal Progressing. Patient reports an improvement in anxiety symptoms.  12/14: Goal met. Patient rates anxiety at 1 today.   Attendees:  Patient:    Family:    Physician: Dr. Adele Schilder, Dr. Shea Evans, MD  08/03/2015 9:30 AM  Nursing: Darrol Angel, Grayland Ormond, [place], [place]  08/03/2015 9:30 AM  Clinical Social Worker Aiysha Jillson, Peri Maris, LCSWA; [place], LCSW 08/03/2015 9:30 AM  Other: Letitia Libra, [facility] 08/03/2015 9:30 AM  Clinical: May Malachi Carl, NP 08/03/2015 9:30 AM  Other: Lars Pinks, CM 08/03/2015 9:30 AM  Other:       Scribe for Treatment Team:  Tilden Fossa, MSW, [place] [phone]

## 2015-08-03 NOTE — Progress Notes (Signed)
Patient ID: [name], female   DOB: 01/16/1996, 19 y.o.   MRN: [id number]  Pt. Denies SI/HI and A/V hallucinations. Belongings returned to patient at time of discharge. Patient denies any pain or discomfort. Discharge instructions and medications were reviewed with patient. Patient verbalized understanding of both medications and discharge instructions. She [facility]. Patient given instructions for bus/train information. Patient discharged to lobby with these tools. Q15 minute safety checks maintained until discharge. No distress upon discharge.

## 2015-08-03 NOTE — Progress Notes (Signed)
CSW left voicemail for patient's aunt Carolyn HouseholderKendra Gibbs [phone]. Awaiting return call.  Samuella BruinKristin Nathaly Dawkins, MSW, [employer]CSWA Clinical Social Worker [facility] [phone]

## 2015-08-03 NOTE — Progress Notes (Signed)
Adult Psychoeducational Group Note  Date:  08/03/2015 Time:  0900  Group Topic/Focus:  Orientation:   The focus of this group is to educate the patient on the purpose and policies of crisis stabilization and provide a format to answer questions about their admission.  The group details unit policies and expectations of patients while admitted.  Participation Level:  Active  Participation Quality:  Appropriate  Affect:  Appropriate  Cognitive:  Appropriate  Insight: Appropriate  Engagement in Group:  Engaged  Modes of Intervention:  Orientation  Additional Comments:    Rickeya Manus L 08/03/2015, 10:07 AM  

## 2015-08-03 NOTE — Discharge Summary (Signed)
Physician Discharge Summary Note  Patient:  Carolyn Gibbs is an 19 y.o., female MRN:  [id number] DOB:  Apr 28, 1996 Patient phone:  [phone] (home)  Patient address:   [address] [place] [address],  Total Time spent with patient: 30 minutes  Date of Admission:  07/26/2015 Date of Discharge: 08/03/2015  Reason for Admission:  Depression  Principal Problem: Major depressive disorder, recurrent, severe without psychotic features [facility]) Discharge Diagnoses: Patient Active Problem List   Diagnosis Date Noted  . Major depressive disorder, recurrent, severe without psychotic features (HCC) [F33.2] 07/26/2015  . Severe recurrent major depression without psychotic features (HCC) [F33.2] 07/25/2015  . Depression with suicidal ideation [F32.9, R45.851]     Past Psychiatric History:  MDD  Past Medical History:  Past Medical History  Diagnosis Date  . Hashimoto's thyroiditis   . Asthma    History reviewed. No pertinent past surgical history. Family History: History reviewed. No pertinent family history. Family Psychiatric  History:  Denies Social History:  History  Alcohol Use No     History  Drug Use No    Social History   Social History  . Marital Status: Single    Spouse Name: N/A  . Number of Children: N/A  . Years of Education: N/A   Social History Main Topics  . Smoking status: Never Smoker   . Smokeless tobacco: None  . Alcohol Use: No  . Drug Use: No  . Sexual Activity: Yes   Other Topics Concern  . None   Social History Narrative    Hospital Course:  Carolyn Gibbs was admitted for Major depressive disorder, recurrent, severe without psychotic features (HCC) and crisis management.  She was treated with the following medications as listed below.  Carolyn Gibbs was discharged with current medication and was instructed on how to take medications as prescribed; (details listed below under Medication List).  Medical problems were identified and treated as  needed.  Home medications were restarted as appropriate.  Improvement was monitored by observation and Carolyn Gibbs daily report of symptom reduction.  Carolyn Gibbs and clinical staff.         Carolyn Gibbs was evaluated by the treatment team for stability and plans for continued recovery upon discharge.  Carolyn Gibbs motivation was an integral factor for scheduling further treatment.  Employment, transportation, bed availability, health status, family support, and any pending legal issues were also considered during her hospital stay. She was offered further treatment options upon discharge including but not limited to Residential, Intensive Outpatient, and Outpatient treatment.  Carolyn Gibbs will follow up with the services as listed below under Follow Up Information.     Upon completion of this admission the Carolyn Gibbs was both mentally and medically stable for discharge denying suicidal/homicidal ideation, auditory/visual/tactile hallucinations, delusional thoughts and paranoia.     Physical Findings: AIMS: Facial and Oral Movements Muscles of Facial Expression: None, normal Lips and Perioral Area: None, normal Jaw: None, normal Tongue: None, normal,Extremity Movements Upper (arms, wrists, hands, fingers): None, normal Lower (legs, knees, ankles, toes): None, normal, Trunk Movements Neck, shoulders, hips: None, normal, Overall Severity Severity of abnormal movements (highest score from questions above): None, normal Incapacitation due to abnormal movements: None, normal Patient's awareness of abnormal movements (rate only patient's report): No Awareness, Dental Status Current problems with teeth and/or dentures?: No Does patient usually wear dentures?: No  CIWA:  CIWA-Ar Total: 1 COWS:  COWS Total Score:  3  Musculoskeletal: Strength & Muscle Tone: within normal limits Gait & Station:  normal Patient leans: N/A  Psychiatric Specialty Exam:  SEE MD SRA Review of Systems  All other systems reviewed and are negative.   Blood pressure 141/80, pulse 109, temperature 98.1 F (36.7 C), temperature source Oral, resp. rate 16, height 5' 2.5" (1.588 m), weight 87.544 kg (193 lb), SpO2 99 %.Body mass index is 34.72 kg/(m^2).  Have you used any form of tobacco in the last 30 days? (Cigarettes, Smokeless Tobacco, Cigars, and/or Pipes): No  Has this patient used any form of tobacco in the last 30 days? (Cigarettes, Smokeless Tobacco, Cigars, and/or Pipes) Yes, N/A  Metabolic Disorder Labs:  No results found for: HGBA1C, MPG No results found for: PROLACTIN No results found for: CHOL, TRIG, HDL, CHOLHDL, VLDL, LDLCALC  See Psychiatric Specialty Exam and Suicide Risk Assessment completed by Attending Physician prior to discharge.  Discharge destination:  Home  Is patient on multiple antipsychotic therapies at discharge:  No   Has Patient had three or more failed trials of antipsychotic monotherapy by history:  No  Recommended Plan for Multiple Antipsychotic Therapies: NA     Medication List    STOP taking these medications        phenylephrine 10 MG Tabs tablet  Commonly known as:  SUDAFED PE     pseudoephedrine-acetaminophen 30-500 MG Tabs tablet  Commonly known as:  TYLENOL SINUS      TAKE these medications      Indication   citalopram 20 MG tablet  Commonly known as:  CELEXA  Take 1 tablet (20 mg total) by mouth daily.   Indication:  Depression     hydrOXYzine 25 MG tablet  Commonly known as:  ATARAX/VISTARIL  Take 1 tablet (25 mg total) by mouth 2 (two) times daily as needed for anxiety.   Indication:  Anxiety Neurosis     traZODone 150 MG tablet  Commonly known as:  DESYREL  Take 0.5 tablets (75 mg total) by mouth at bedtime as needed for sleep.   Indication:  Trouble Sleeping         Follow-up recommendations:  Activity:  as tol Diet:  as  tol  Comments:  1.  Take all your medications as prescribed.              2.  Report any adverse side effects to outpatient provider.                       3.  Patient instructed to not use alcohol or illegal drugs while on prescription medicines.            4.  In the event of worsening symptoms, instructed patient to call 911, the crisis hotline or go to nearest emergency room for evaluation of symptoms.  Signed: Velna HatchetSheila May Wallie Lagrand AGNP-BC 08/03/2015, 9:32 AM

## 2015-08-03 NOTE — [facility] Suicide Risk Assessment (Signed)
[facility]   Demographic Factors:  Caucasian  Total Time spent with patient: 30 minutes  Musculoskeletal: Strength & Muscle Tone: within normal limits Gait & Station: normal Patient leans: N/A  Psychiatric Specialty Exam: Physical Exam  Review of Systems  Psychiatric/Behavioral: Negative for depression and suicidal ideas.  All other systems reviewed and are negative.   Blood pressure 141/80, pulse 109, temperature 98.1 F (36.7 C), temperature source Oral, resp. rate 16, height 5' 2.5" (1.588 m), weight 87.544 kg (193 lb), SpO2 99 %.Body mass index is 34.72 kg/(m^2).  General Appearance: Casual  Eye Contact::  Fair  Speech:  Clear and Coherent409  Volume:  Normal  Mood:  Euthymic  Affect:  Appropriate  Thought Process:  Coherent  Orientation:  Full (Time, Place, and Person)  Thought Content:  WDL  Suicidal Thoughts:  No  Homicidal Thoughts:  No  Memory:  Immediate;   Fair Recent;   Fair Remote;   Fair  Judgement:  Fair  Insight:  Fair  Psychomotor Activity:  Normal  Concentration:  Fair  Recall:  [employer]  Fund of Knowledge:Fair  Language: Fair  Akathisia:  No  Handed:  Right  AIMS (if indicated):     Assets:  Communication Skills Desire for Improvement  Sleep:  Number of Hours: 6.25  Cognition: WNL  ADL's:  Intact   Have you used any form of tobacco in the last 30 days? (Cigarettes, Smokeless Tobacco, Cigars, and/or Pipes): No  Has this patient used any form of tobacco in the last 30 days? (Cigarettes, Smokeless Tobacco, Cigars, and/or Pipes) No  Mental Status Per Nursing Assessment::   On Admission:     Current Mental Status by Physician: Pt denies SI/HI/AH/VH  Loss Factors: NA  Historical Factors: Impulsivity  Risk Reduction Factors:   Positive social support  Continued Clinical Symptoms:  Previous Psychiatric Diagnoses and Treatments  Cognitive Features That Contribute To Risk:  None    Suicide Risk:  Minimal: No  identifiable suicidal ideation.  Patients presenting with no risk factors but with morbid ruminations; may be classified as minimal risk based on the severity of the depressive symptoms  Principal Problem: Major depressive disorder, recurrent, severe without psychotic features (HCC) ( IMPROVED) Discharge Diagnoses:  Patient Active Problem List   Diagnosis Date Noted  . Major depressive disorder, recurrent, severe without psychotic features (HCC) [F33.2] 07/26/2015      Plan Of Care/Follow-up recommendations:  Activity:  No restrictions Diet:  regular Tests:  as needed Other:  follow up with after care  Is patient on multiple antipsychotic therapies at discharge:  No   Has Patient had three or more failed trials of antipsychotic monotherapy by history:  No  Recommended Plan for Multiple Antipsychotic Therapies: NA    Leomar Westberg MD 08/03/2015, 9:57 AM

## 2015-08-03 NOTE — Progress Notes (Signed)
D    Pt appears sad and anxious   She also complained of some congestion but refused her nasal spray but did take a throat lozenge   She interacts appropriately with others and is pleasant on approach and cooperative with treatment A   Verbal support given    Medications administered and effectiveness monitored    Q 15 min checks R   Pt safe at present

## 2015-08-03 NOTE — Progress Notes (Addendum)
  [name] :  Will you be returning to the same living situation after discharge:  No. [name] At discharge, do you have transportation home?: Yes,  patient will be provided with train ticket and bus fare Do you have the ability to pay for your medications: Yes,  patient will be provided with prescriptions at discharge  Release of information consent forms completed and in the chart;  Patient's signature needed at discharge.  Patient to Follow up at:  [name]  Walk-in clinic every Monday & Wednesday from 10am to 2pm for assessment for therapy and medication management services.  [address] [name] (located on the [address] bus route) Phone: [phone] Fax: [phone]    Next level of care provider has [facility] to [facility] Link:no  Safety Planning and Suicide Prevention discussed: Yes,  with patient and boyfriend  Have you used any form of tobacco in the last 30 days? (Cigarettes, Smokeless Tobacco, Cigars, and/or Pipes): No  Has patient been referred to the Quitline?: Patient refused referral  Patient has been referred for addiction treatment: N/A  Carolyn Gibbs, Carolyn CarboKristin Gibbs 08/03/2015, 8:37 AM

## 2015-10-11 ENCOUNTER — Encounter: Payer: Self-pay | Admitting: Emergency Medicine

## 2015-10-11 ENCOUNTER — Ambulatory Visit (INDEPENDENT_AMBULATORY_CARE_PROVIDER_SITE_OTHER): Payer: Medicaid Other

## 2015-10-11 ENCOUNTER — Ambulatory Visit
Admission: EM | Admit: 2015-10-11 | Discharge: 2015-10-11 | Disposition: A | Discharge status: Home / Self Care | Payer: Medicaid Other | Attending: Family Medicine | Admitting: Family Medicine

## 2015-10-11 DIAGNOSIS — S60212A Contusion of left wrist, initial encounter: Secondary | ICD-10-CM

## 2015-10-11 HISTORY — DX: Bipolar disorder, unspecified: F31.9

## 2015-10-11 LAB — PREGNANCY, URINE: PREG TEST UR: NEGATIVE
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
## 2015-10-11 NOTE — Discharge Instructions (Signed)

## 2017-04-25 IMAGING — CR DG WRIST COMPLETE 3+V*L*
4 series · 4 of 4 positions shown · non-contrast
Comparison: None.

CLINICAL DATA: Wrist pain and swelling secondary to blunt trauma
yesterday.

EXAM:
LEFT WRIST - COMPLETE 3+ VIEW

[wrist pa]
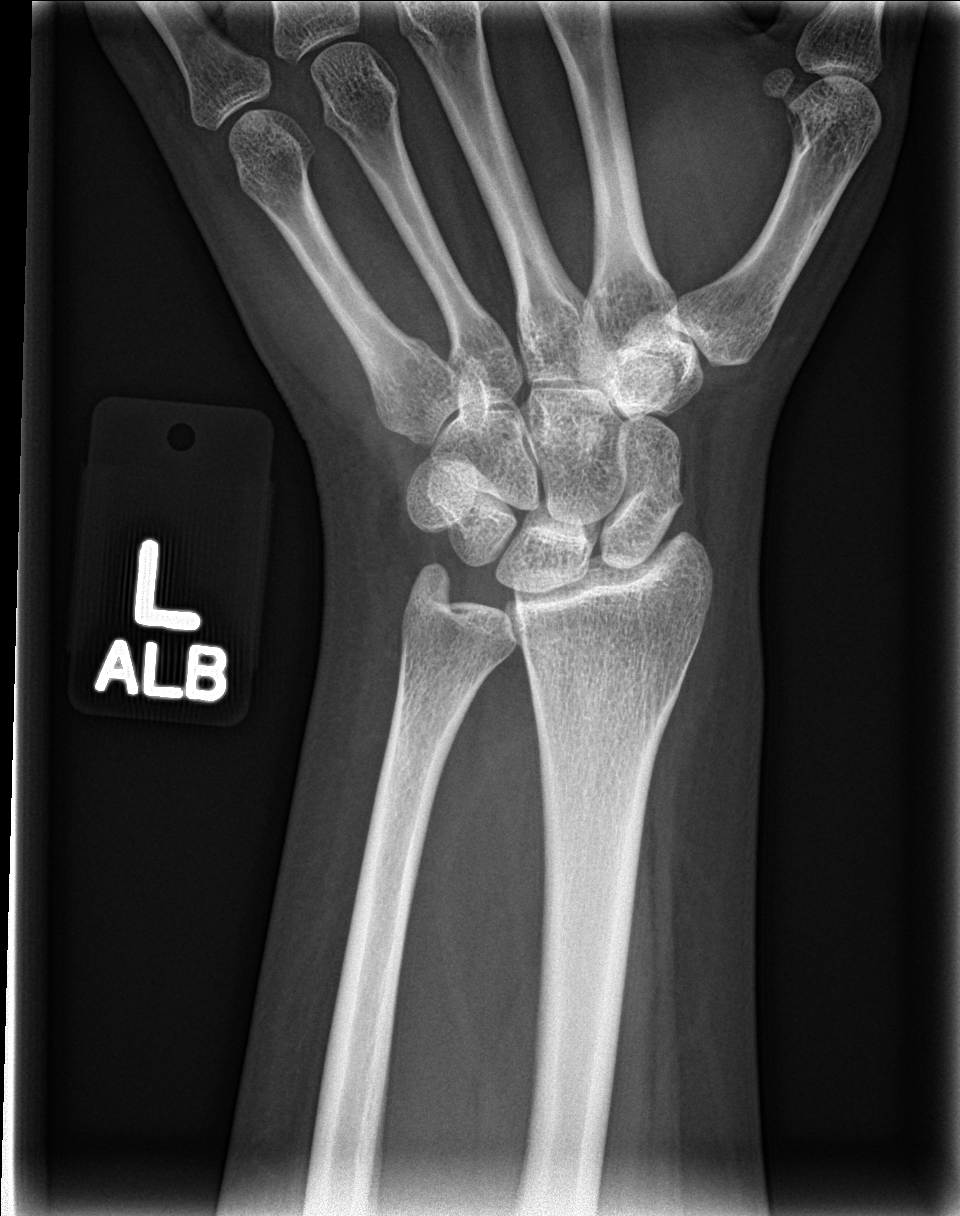

[wrist obl]
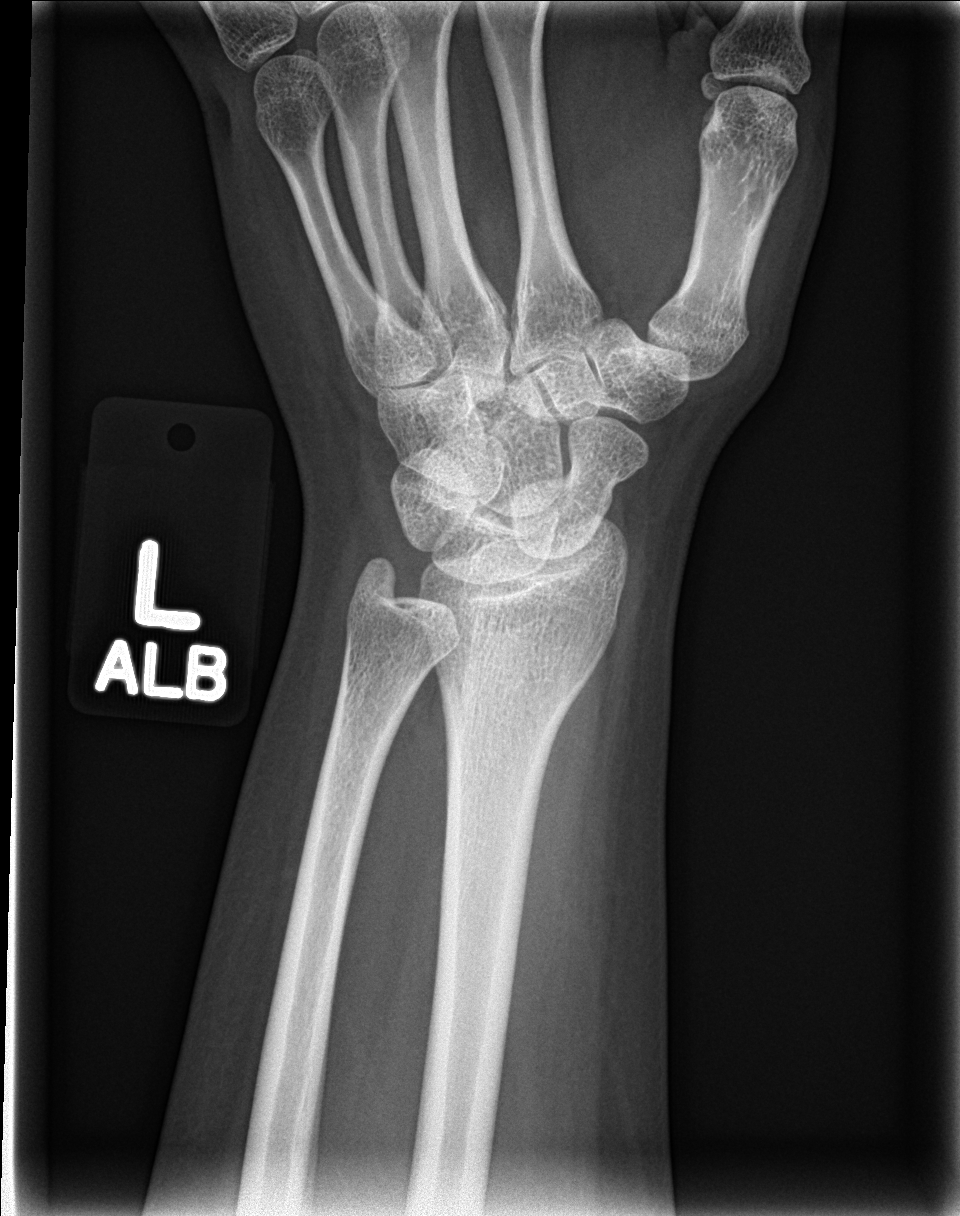

[wrist lat]
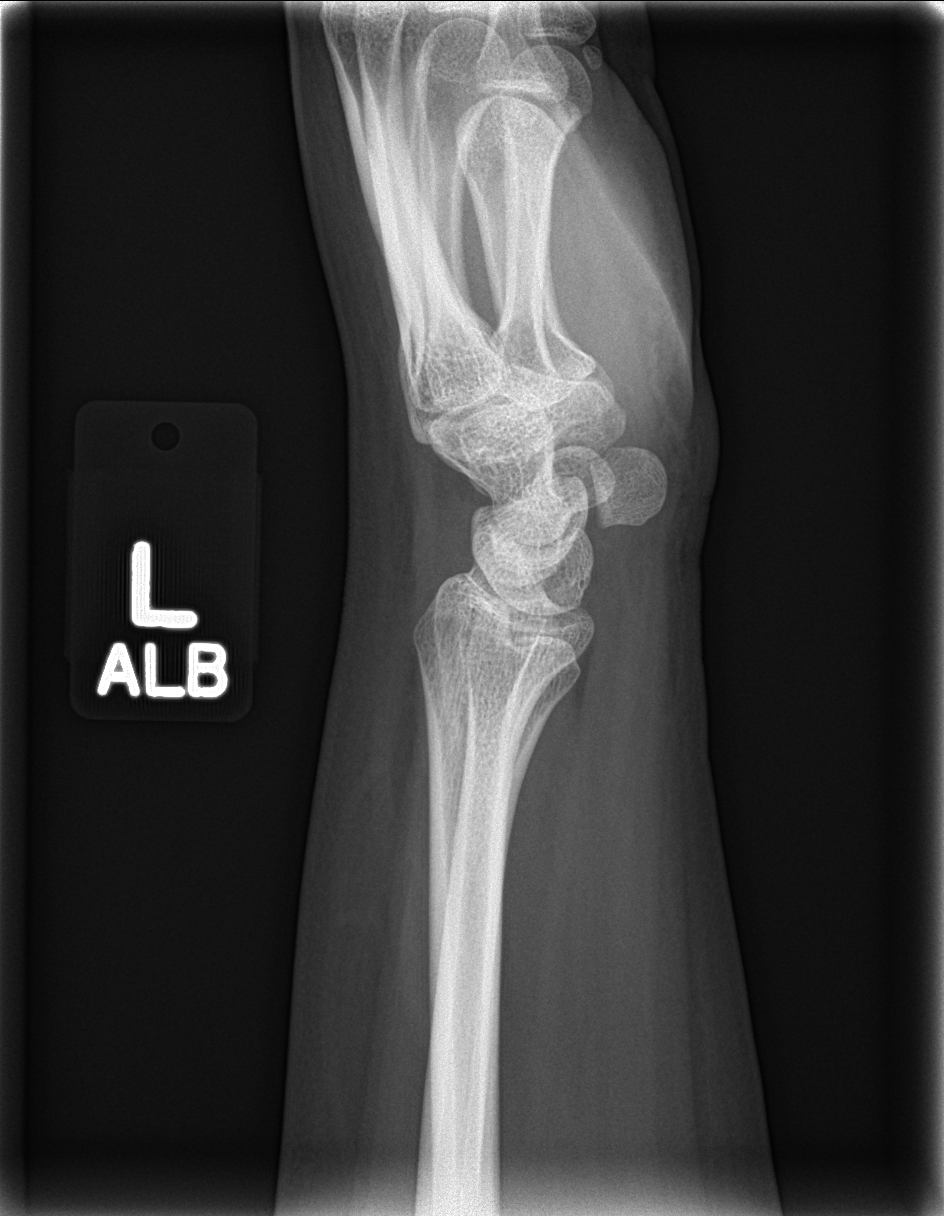

[wrist navicular]
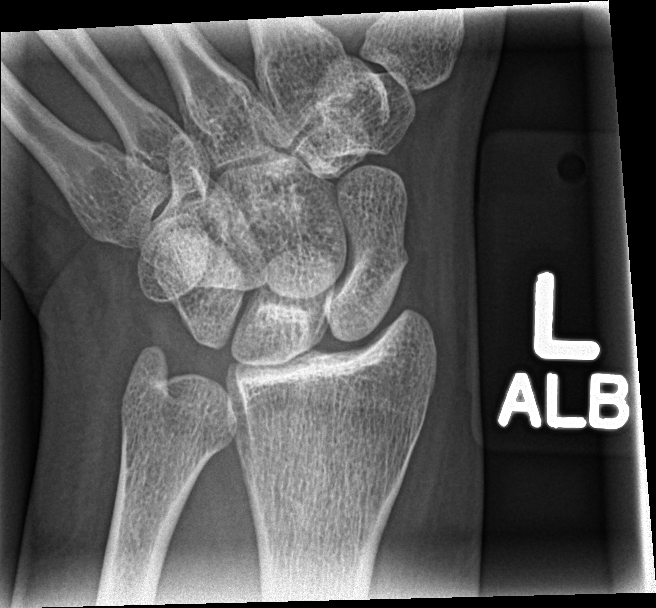

[4 of 4 positions shown; findings below may reference images not displayed]

FINDINGS: There is no evidence of fracture or dislocation. There is no
evidence of arthropathy or other focal bone abnormality. Soft
tissues are unremarkable.
IMPRESSION: Negative.
